# Patient Record
Sex: Male | Born: 1960
Health system: Southern US, Community
[De-identification: ages and names within clinical notes are randomized; demographics above are authoritative.]

## PROBLEM LIST (undated history)

## (undated) DIAGNOSIS — F17203 Nicotine dependence unspecified, with withdrawal: Secondary | ICD-10-CM

## (undated) DIAGNOSIS — K219 Gastro-esophageal reflux disease without esophagitis: Secondary | ICD-10-CM

## (undated) DIAGNOSIS — R0902 Hypoxemia: Secondary | ICD-10-CM

## (undated) DIAGNOSIS — E782 Mixed hyperlipidemia: Secondary | ICD-10-CM

## (undated) DIAGNOSIS — M5106 Intervertebral disc disorders with myelopathy, lumbar region: Secondary | ICD-10-CM

## (undated) DIAGNOSIS — K2100 Gastro-esophageal reflux disease with esophagitis, without bleeding: Secondary | ICD-10-CM

## (undated) DIAGNOSIS — M1990 Unspecified osteoarthritis, unspecified site: Secondary | ICD-10-CM

## (undated) DIAGNOSIS — I10 Essential (primary) hypertension: Secondary | ICD-10-CM

## (undated) DIAGNOSIS — T7840XA Allergy, unspecified, initial encounter: Secondary | ICD-10-CM

## (undated) HISTORY — DX: Mixed hyperlipidemia: E78.2

## (undated) HISTORY — DX: Allergy, unspecified, initial encounter: T78.40XA

## (undated) HISTORY — DX: Unspecified osteoarthritis, unspecified site: M19.90

## (undated) HISTORY — PX: TUMOR EXCISION: SHX421

## (undated) HISTORY — DX: Essential (primary) hypertension: I10

## (undated) HISTORY — DX: Gastro-esophageal reflux disease without esophagitis: K21.9

## (undated) HISTORY — DX: Gastro-esophageal reflux disease with esophagitis, without bleeding: K21.00

## (undated) HISTORY — PX: COLONOSCOPY: SHX174

## (undated) HISTORY — PX: SPINE SURGERY: SHX786

## (undated) HISTORY — DX: Hypoxemia: R09.02

## (undated) HISTORY — PX: HERNIA REPAIR: SHX51

## (undated) HISTORY — DX: Nicotine dependence unspecified, with withdrawal: F17.203

## (undated) HISTORY — PX: CHEST SURGERY: SHX595

## (undated) HISTORY — DX: Intervertebral disc disorders with myelopathy, lumbar region: M51.06

## (undated) HISTORY — PX: LUMBAR DISC SURGERY: SHX700

## (undated) HISTORY — PX: APPENDECTOMY: SHX54

---

## 2002-01-19 ENCOUNTER — Encounter: Payer: Self-pay | Admitting: Neurosurgery

## 2002-01-19 ENCOUNTER — Ambulatory Visit (HOSPITAL_COMMUNITY): Admission: RE | Admit: 2002-01-19 | Discharge: 2002-01-20 | Payer: Self-pay | Admitting: Neurosurgery

## 2002-02-24 ENCOUNTER — Ambulatory Visit (HOSPITAL_COMMUNITY): Admission: RE | Admit: 2002-02-24 | Discharge: 2002-02-24 | Payer: Self-pay | Admitting: Neurosurgery

## 2002-02-24 ENCOUNTER — Encounter: Payer: Self-pay | Admitting: Neurosurgery

## 2002-04-04 ENCOUNTER — Encounter: Payer: Self-pay | Admitting: Neurosurgery

## 2002-04-04 ENCOUNTER — Encounter: Admission: RE | Admit: 2002-04-04 | Discharge: 2002-04-04 | Payer: Self-pay | Admitting: Neurosurgery

## 2002-04-16 ENCOUNTER — Encounter: Admission: RE | Admit: 2002-04-16 | Discharge: 2002-04-16 | Payer: Self-pay | Admitting: Neurosurgery

## 2002-04-16 ENCOUNTER — Encounter: Payer: Self-pay | Admitting: Neurosurgery

## 2002-05-28 ENCOUNTER — Encounter: Admission: RE | Admit: 2002-05-28 | Discharge: 2002-05-28 | Payer: Self-pay | Admitting: Neurosurgery

## 2002-05-28 ENCOUNTER — Encounter: Payer: Self-pay | Admitting: Neurosurgery

## 2002-09-06 ENCOUNTER — Encounter: Admission: RE | Admit: 2002-09-06 | Discharge: 2002-09-06 | Payer: Self-pay | Admitting: Neurosurgery

## 2002-09-06 ENCOUNTER — Encounter: Payer: Self-pay | Admitting: Neurosurgery

## 2004-03-16 ENCOUNTER — Inpatient Hospital Stay (HOSPITAL_COMMUNITY): Admission: RE | Admit: 2004-03-16 | Discharge: 2004-03-21 | Payer: Self-pay | Admitting: Surgery

## 2004-04-07 ENCOUNTER — Encounter: Admission: RE | Admit: 2004-04-07 | Discharge: 2004-04-07 | Payer: Self-pay | Admitting: Surgery

## 2005-09-07 IMAGING — CR DG CHEST 2V
2 series · 2 of 2 positions shown · non-contrast
Comparison: none

CLINICAL DATA: Paraesophageal mass.  Chest pain.  Cough.  
 TWO-VIEW CHEST   
 Comparison 01/19/02.
 A middle mediastinal mass is again seen displacing the trachea anteriorly.  This is not specifically changed.  Heart size remains normal.  Both lungs are clear.
 IMPRESSION 
 Middle mediastinal mass displacing the trachea anteriorly.  No significant change since prior study.
 No acute pulmonary process.

[view not recorded (1 of 2)]
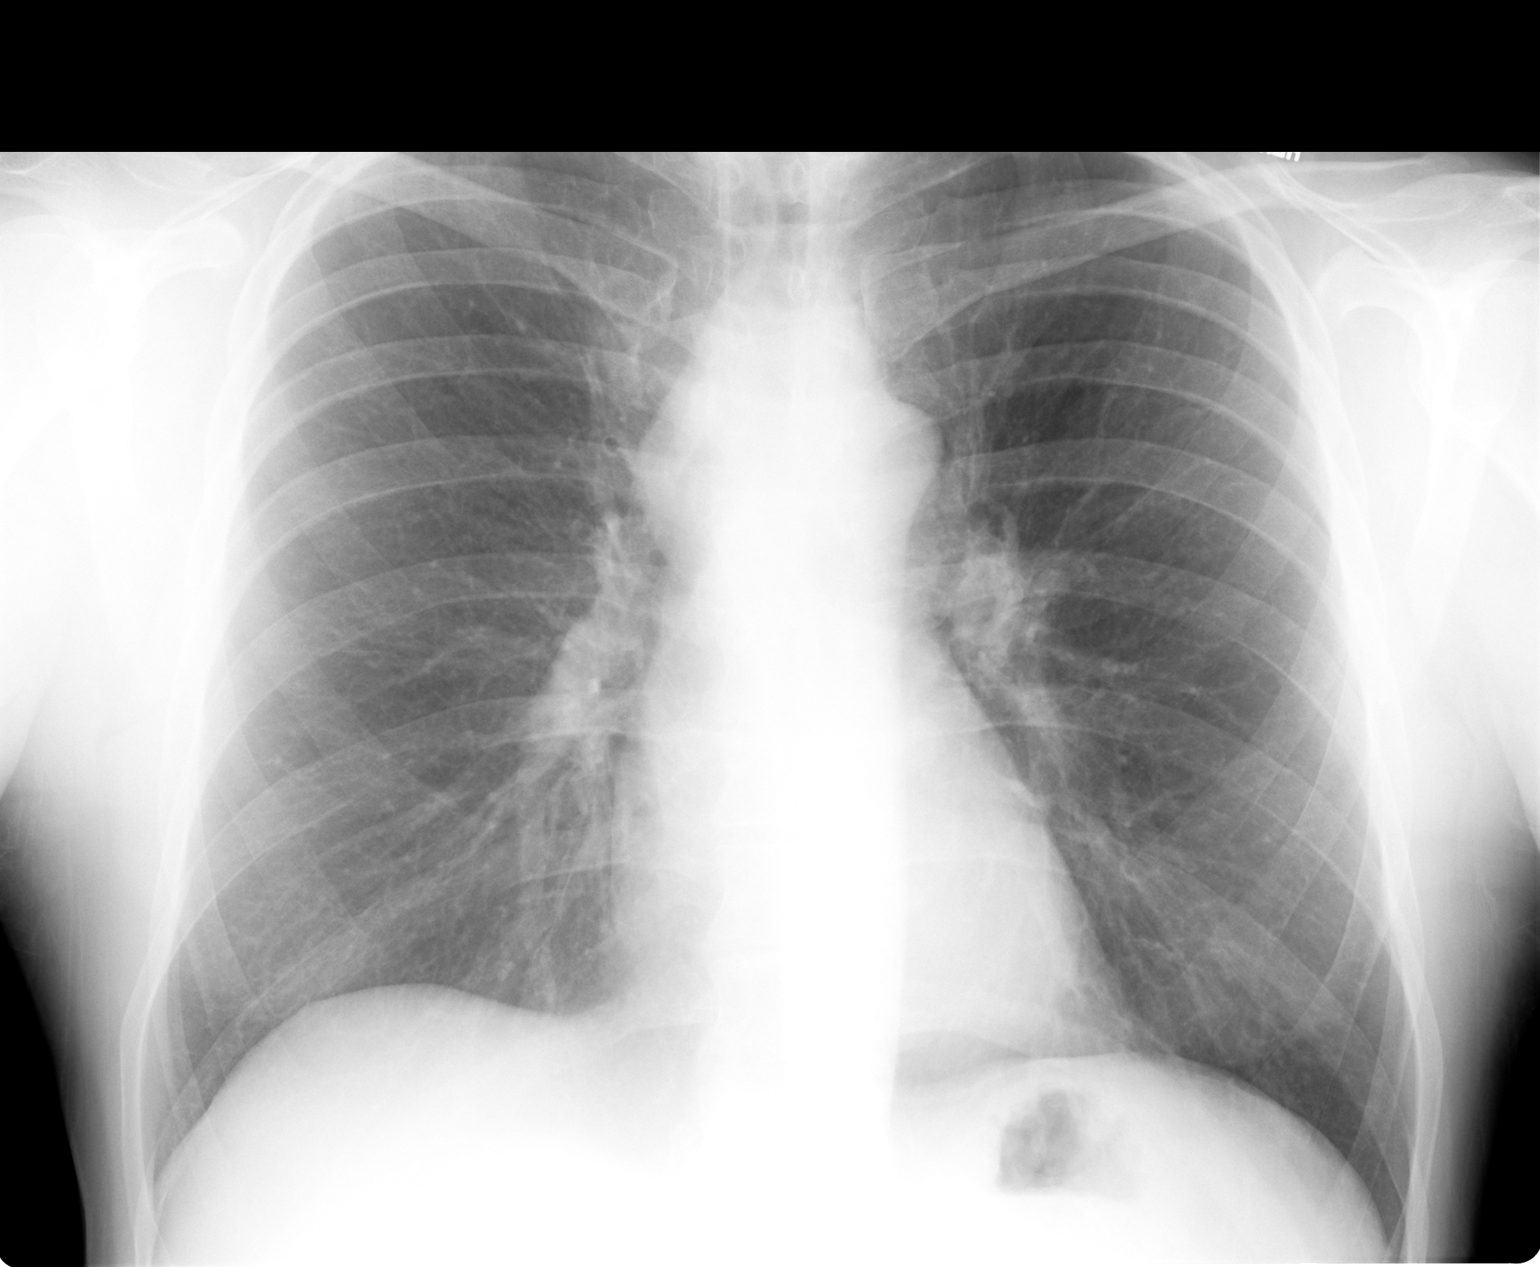

[view not recorded (2 of 2)]
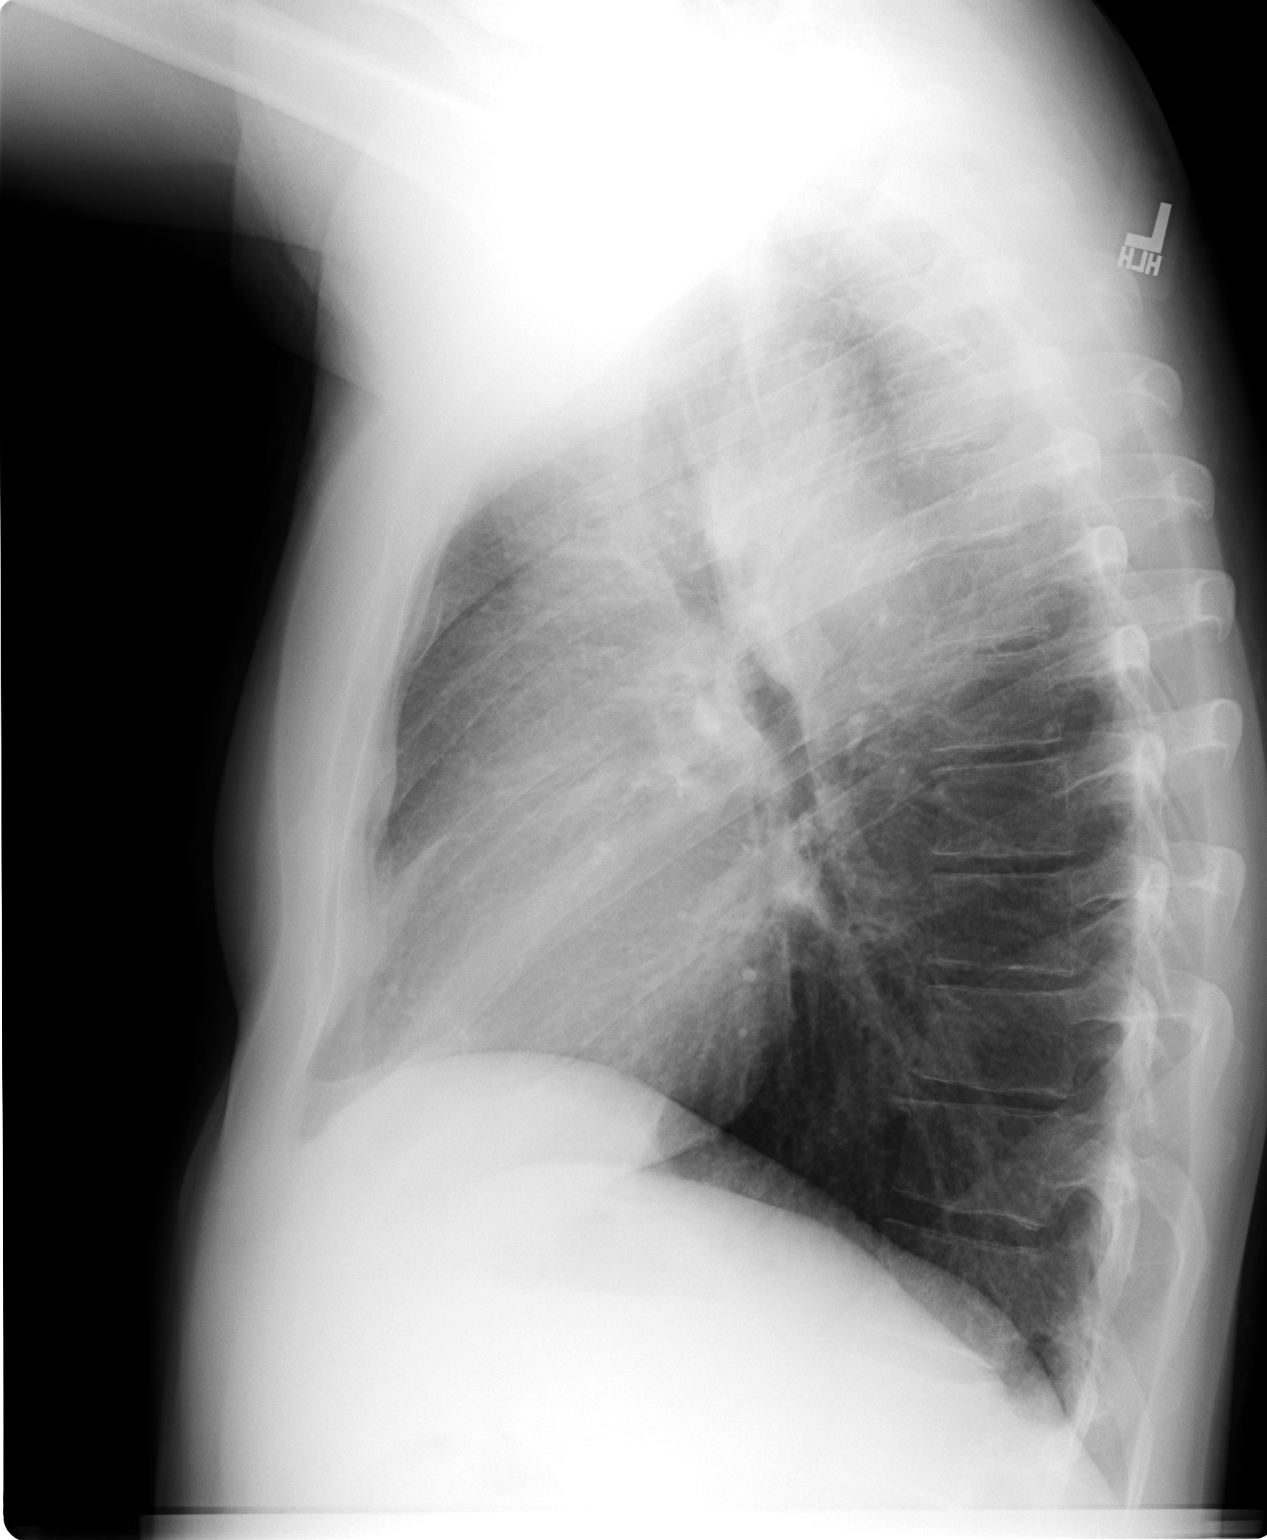

[2 of 2 positions shown; findings below may reference images not displayed]

## 2005-09-08 IMAGING — CR DG CHEST 1V PORT
1 series · 1 of 1 positions shown · non-contrast
Comparison: none

CLINICAL DATA: Surgery for paraesophageal mass.  Follow-up.  
 CHEST PORTABLE ONE VIEW
 6046 HOURS:  Comparison is made to yesterday?s exam.  NG tube is noted traversing the esophagus and proximal stomach.  Right subclavian central venous catheter tip is in the SVC.  Definitely two and possibly three right pleural chest tubes are unchanged in position.  Some improvement in aeration of the lung bases and overall decrease in perivascular edema.  Subsegmental atelectasis at the bases is still present.  No pneumothorax.  Stable appearance of cardiomediastinal silhouette.
 IMPRESSION
 Improved aeration of the lungs.  Bibasilar atelectatic changes are again noted.

[view not recorded]
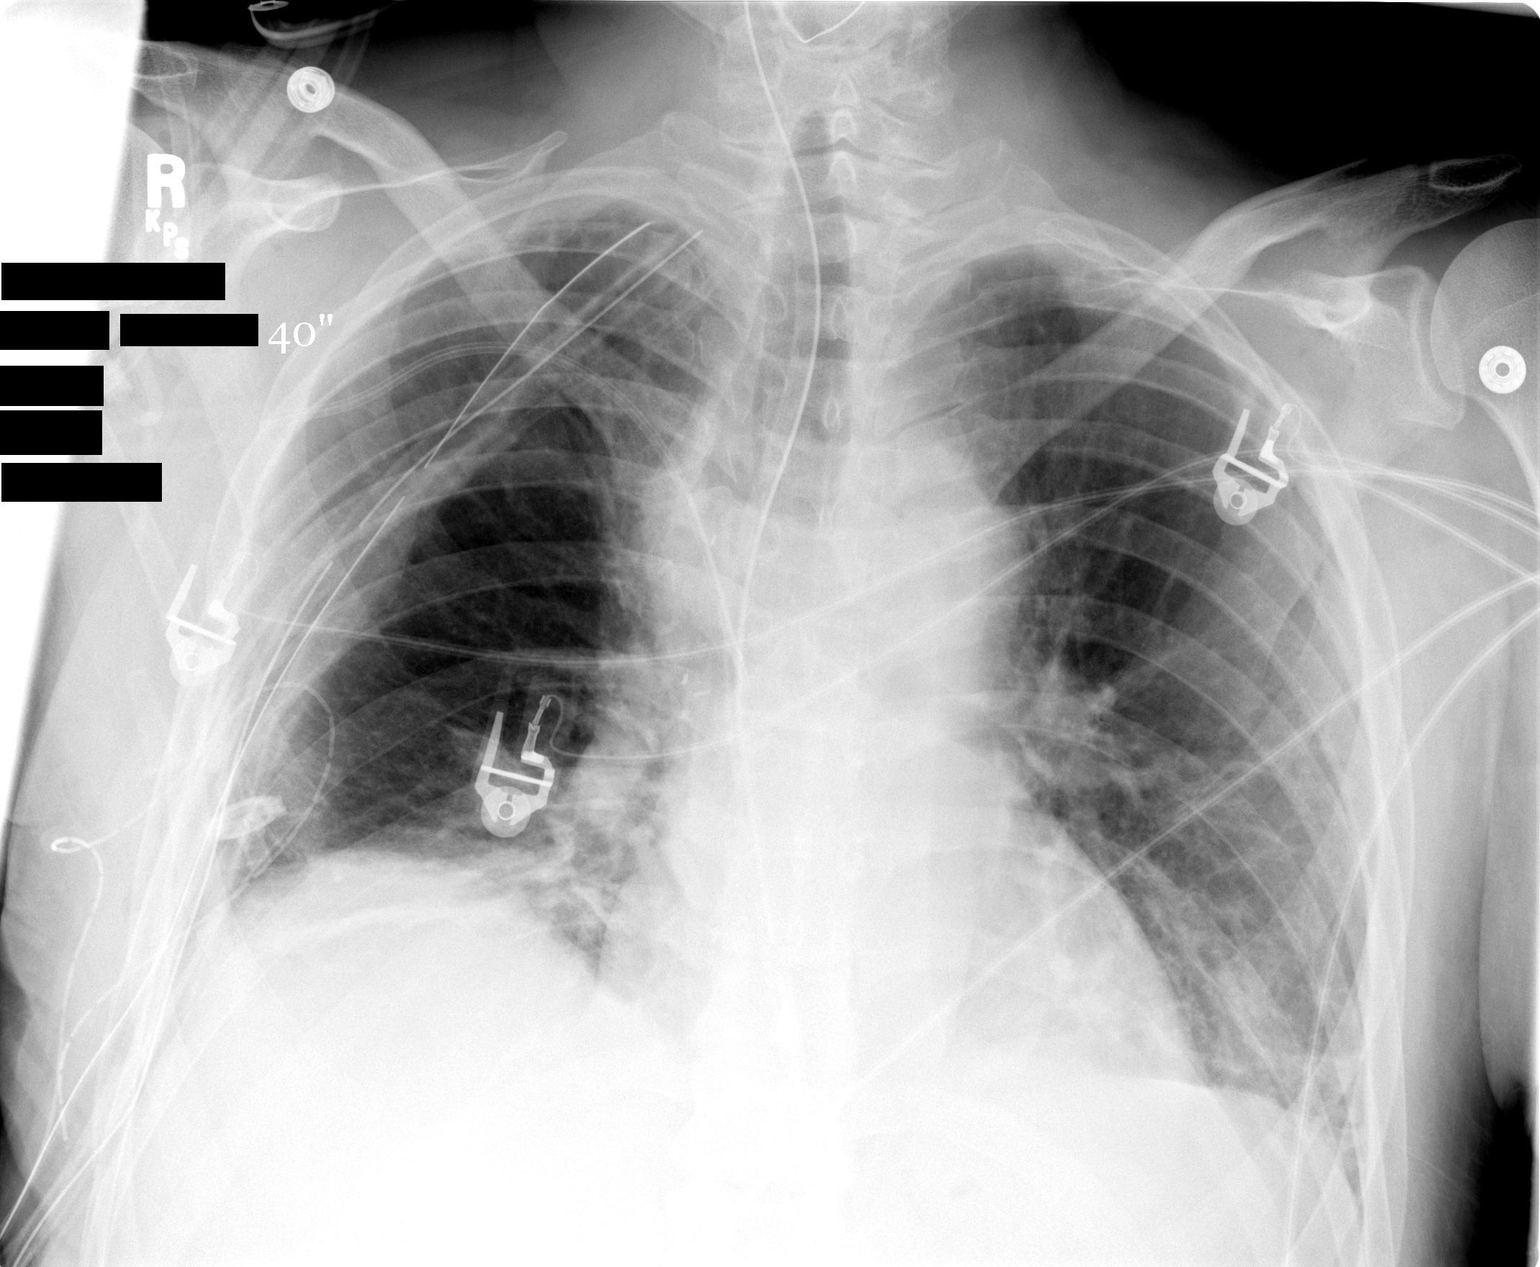

[1 of 1 positions shown; findings below may reference images not displayed]

## 2005-09-11 IMAGING — CR DG CHEST 2V
2 series · 2 of 2 positions shown · non-contrast
Comparison: none

CLINICAL DATA: Status post surgical resection of a large paraesophageal mass four days ago.
 TWO-VIEW CHEST, 03/20/04
 Comparison 03/18/04.
 Interval removal of one of the right chest tubes.  A very tiny right lateral pneumothorax is currently seen, occupying approximately 2 percent of the volume of the right hemithorax.  Stable right subclavian catheter.  Decreased prominence of the right paratracheal soft tissues, at the location of the mass seen on 03/16/04.  Mild bibasilar atelectasis, improved on the left and worse on the right.  Normal sized heart.
 IMPRESSION 
 Interval removal of one of the two right chest tubes with an approximately 2 percent right lateral pneumothorax currently seen.
 Improved postoperative soft tissue swelling in the right paratracheal and azygous regions.  
 Mild bibasilar atelectasis, improved on the left and worse on the right.
 ESOPHAGRAM, 03/20/04
 The patient swallowed Gastrografin without difficulty and without extravasation.  The patient then swallowed barium without difficulty, without extravasation.  A small sliding hiatal hernia is noted.  No luminal narrowing demonstrated.
 No extravasation.
 Small sliding hiatal hernia.

[view not recorded (1 of 2)]
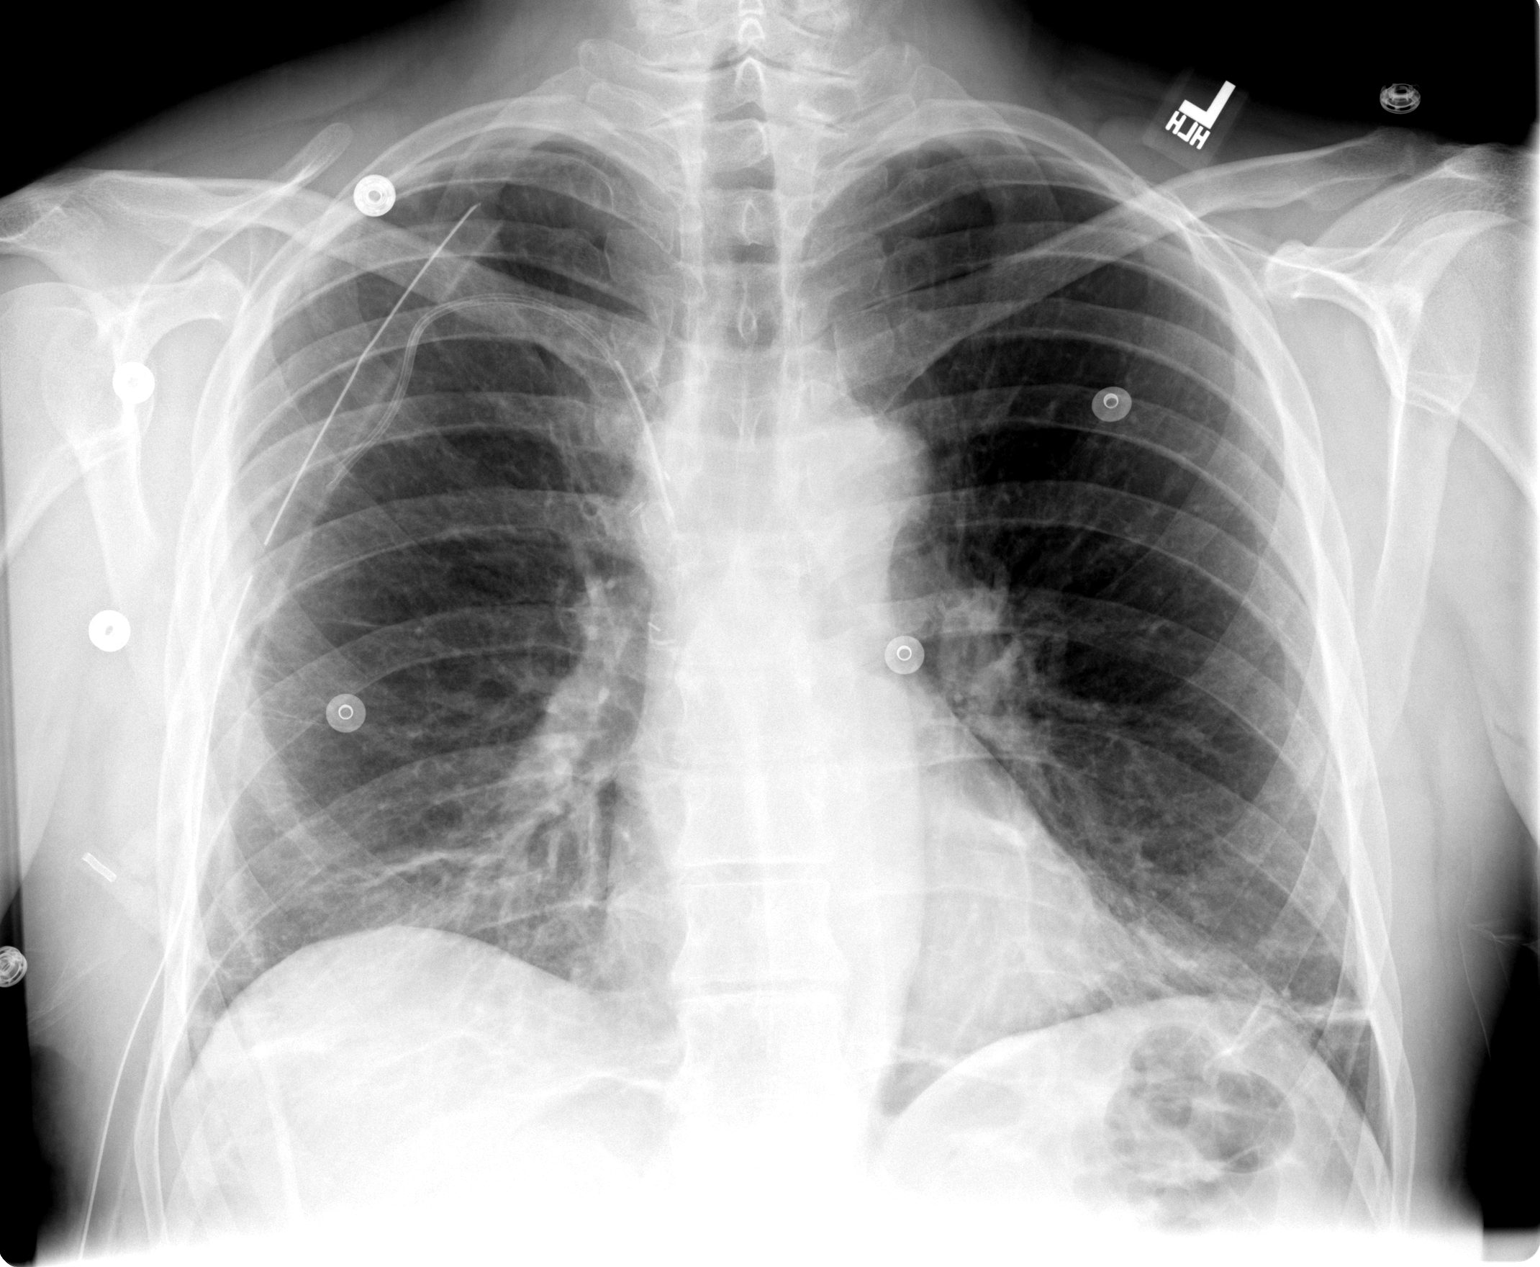

[view not recorded (2 of 2)]
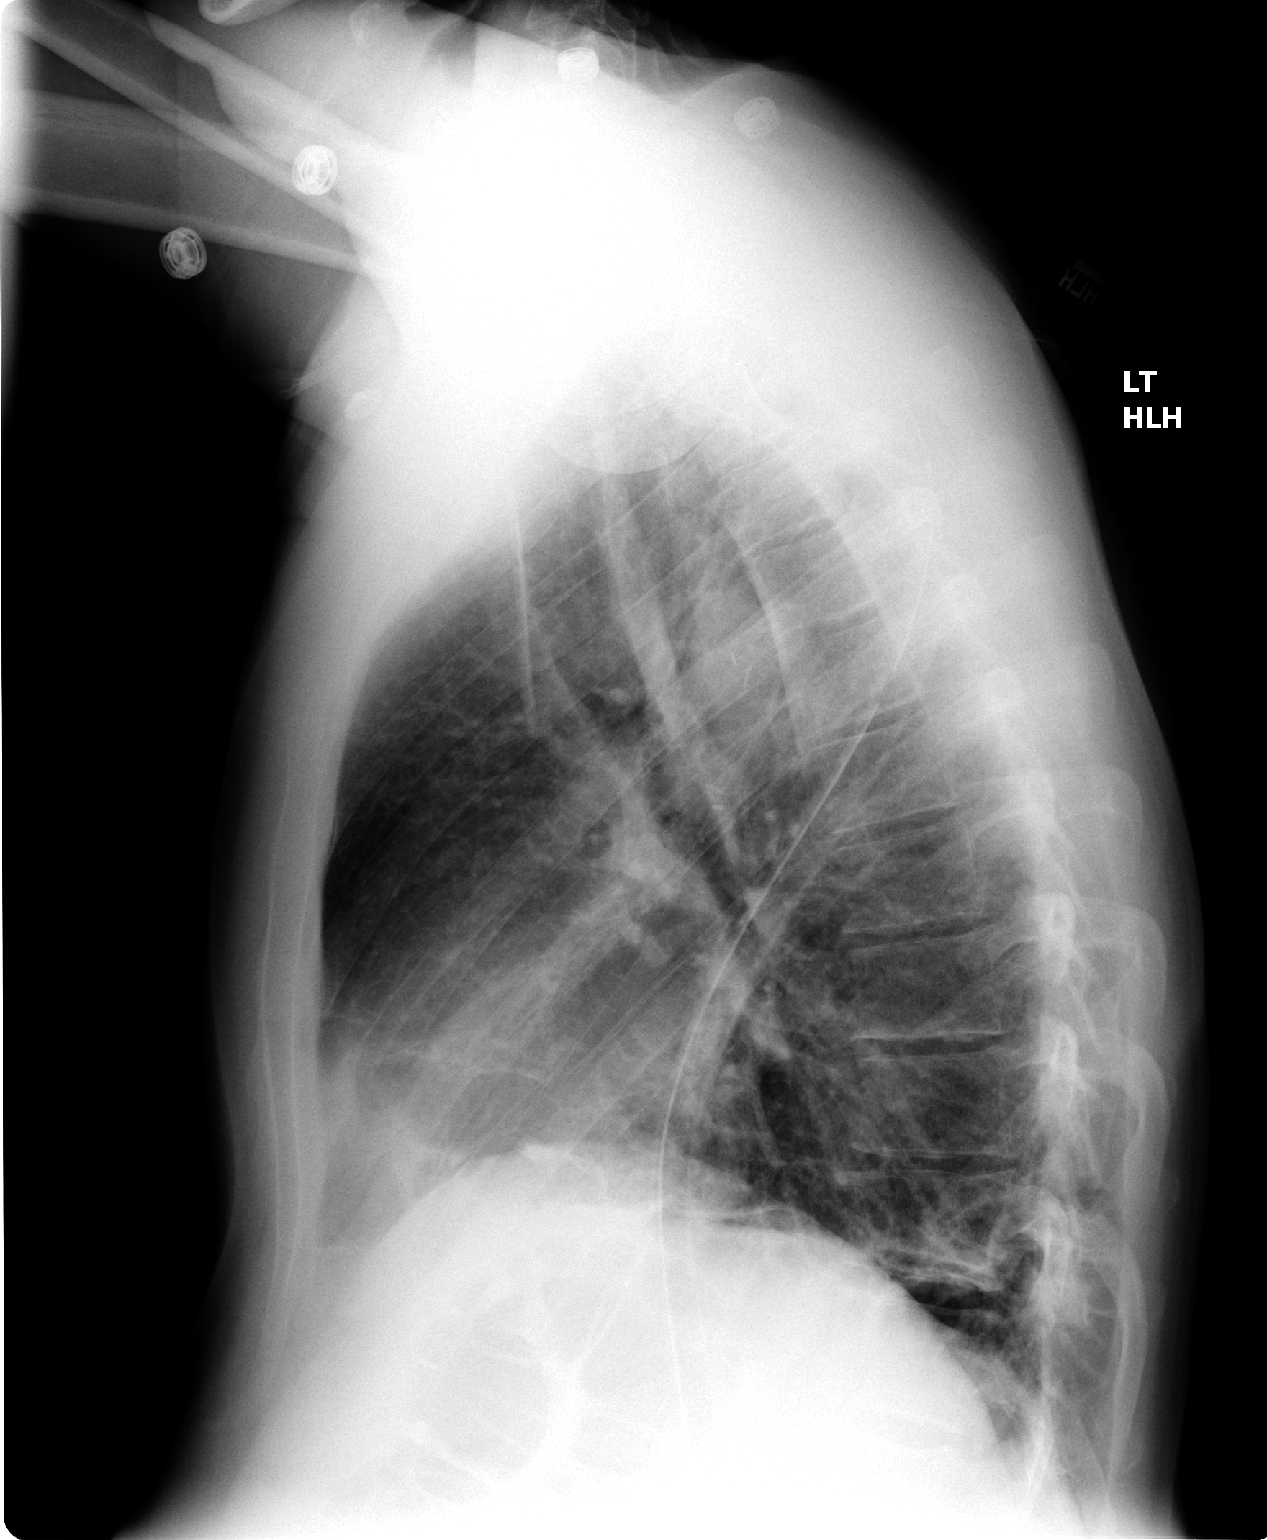

[2 of 2 positions shown; findings below may reference images not displayed]

## 2011-12-08 DIAGNOSIS — D179 Benign lipomatous neoplasm, unspecified: Secondary | ICD-10-CM | POA: Diagnosis not present

## 2012-03-02 DIAGNOSIS — M5126 Other intervertebral disc displacement, lumbar region: Secondary | ICD-10-CM | POA: Diagnosis not present

## 2012-03-02 DIAGNOSIS — IMO0002 Reserved for concepts with insufficient information to code with codable children: Secondary | ICD-10-CM | POA: Diagnosis not present

## 2012-03-02 DIAGNOSIS — M545 Low back pain: Secondary | ICD-10-CM | POA: Diagnosis not present

## 2012-03-02 DIAGNOSIS — M5412 Radiculopathy, cervical region: Secondary | ICD-10-CM | POA: Diagnosis not present

## 2012-03-16 DIAGNOSIS — I10 Essential (primary) hypertension: Secondary | ICD-10-CM | POA: Diagnosis not present

## 2012-03-16 DIAGNOSIS — E7800 Pure hypercholesterolemia, unspecified: Secondary | ICD-10-CM | POA: Diagnosis not present

## 2012-04-12 DIAGNOSIS — IMO0002 Reserved for concepts with insufficient information to code with codable children: Secondary | ICD-10-CM | POA: Diagnosis not present

## 2012-04-12 DIAGNOSIS — M545 Low back pain: Secondary | ICD-10-CM | POA: Diagnosis not present

## 2012-04-12 DIAGNOSIS — M5412 Radiculopathy, cervical region: Secondary | ICD-10-CM | POA: Diagnosis not present

## 2012-04-21 DIAGNOSIS — J189 Pneumonia, unspecified organism: Secondary | ICD-10-CM | POA: Diagnosis not present

## 2012-04-21 DIAGNOSIS — R0602 Shortness of breath: Secondary | ICD-10-CM | POA: Diagnosis not present

## 2012-04-21 DIAGNOSIS — R079 Chest pain, unspecified: Secondary | ICD-10-CM | POA: Diagnosis not present

## 2012-04-21 DIAGNOSIS — F17200 Nicotine dependence, unspecified, uncomplicated: Secondary | ICD-10-CM | POA: Diagnosis not present

## 2012-04-22 DIAGNOSIS — J189 Pneumonia, unspecified organism: Secondary | ICD-10-CM | POA: Diagnosis not present

## 2012-04-22 DIAGNOSIS — R079 Chest pain, unspecified: Secondary | ICD-10-CM | POA: Diagnosis not present

## 2012-04-22 DIAGNOSIS — F17200 Nicotine dependence, unspecified, uncomplicated: Secondary | ICD-10-CM | POA: Diagnosis not present

## 2012-06-16 DIAGNOSIS — M545 Low back pain: Secondary | ICD-10-CM | POA: Diagnosis not present

## 2012-06-16 DIAGNOSIS — M5126 Other intervertebral disc displacement, lumbar region: Secondary | ICD-10-CM | POA: Diagnosis not present

## 2012-06-16 DIAGNOSIS — M5412 Radiculopathy, cervical region: Secondary | ICD-10-CM | POA: Diagnosis not present

## 2012-06-16 DIAGNOSIS — IMO0002 Reserved for concepts with insufficient information to code with codable children: Secondary | ICD-10-CM | POA: Diagnosis not present

## 2012-06-28 DIAGNOSIS — IMO0002 Reserved for concepts with insufficient information to code with codable children: Secondary | ICD-10-CM | POA: Diagnosis not present

## 2012-07-11 DIAGNOSIS — IMO0002 Reserved for concepts with insufficient information to code with codable children: Secondary | ICD-10-CM | POA: Diagnosis not present

## 2012-07-11 DIAGNOSIS — M533 Sacrococcygeal disorders, not elsewhere classified: Secondary | ICD-10-CM | POA: Diagnosis not present

## 2012-08-02 DIAGNOSIS — IMO0002 Reserved for concepts with insufficient information to code with codable children: Secondary | ICD-10-CM | POA: Diagnosis not present

## 2012-08-02 DIAGNOSIS — M545 Low back pain: Secondary | ICD-10-CM | POA: Diagnosis not present

## 2012-10-24 DIAGNOSIS — Z125 Encounter for screening for malignant neoplasm of prostate: Secondary | ICD-10-CM | POA: Diagnosis not present

## 2012-10-24 DIAGNOSIS — I10 Essential (primary) hypertension: Secondary | ICD-10-CM | POA: Diagnosis not present

## 2012-10-24 DIAGNOSIS — Z79899 Other long term (current) drug therapy: Secondary | ICD-10-CM | POA: Diagnosis not present

## 2012-10-24 DIAGNOSIS — E7800 Pure hypercholesterolemia, unspecified: Secondary | ICD-10-CM | POA: Diagnosis not present

## 2013-04-09 DIAGNOSIS — E7800 Pure hypercholesterolemia, unspecified: Secondary | ICD-10-CM | POA: Diagnosis not present

## 2013-04-09 DIAGNOSIS — F17200 Nicotine dependence, unspecified, uncomplicated: Secondary | ICD-10-CM | POA: Diagnosis not present

## 2013-04-09 DIAGNOSIS — I10 Essential (primary) hypertension: Secondary | ICD-10-CM | POA: Diagnosis not present

## 2013-06-04 DIAGNOSIS — M542 Cervicalgia: Secondary | ICD-10-CM | POA: Diagnosis not present

## 2013-06-04 DIAGNOSIS — Z006 Encounter for examination for normal comparison and control in clinical research program: Secondary | ICD-10-CM | POA: Diagnosis not present

## 2013-06-04 DIAGNOSIS — I10 Essential (primary) hypertension: Secondary | ICD-10-CM | POA: Diagnosis not present

## 2013-06-04 DIAGNOSIS — R079 Chest pain, unspecified: Secondary | ICD-10-CM | POA: Diagnosis not present

## 2013-06-07 DIAGNOSIS — R079 Chest pain, unspecified: Secondary | ICD-10-CM | POA: Diagnosis not present

## 2013-09-18 DIAGNOSIS — R109 Unspecified abdominal pain: Secondary | ICD-10-CM | POA: Diagnosis not present

## 2013-09-24 DIAGNOSIS — M545 Low back pain: Secondary | ICD-10-CM | POA: Diagnosis not present

## 2013-09-24 DIAGNOSIS — IMO0002 Reserved for concepts with insufficient information to code with codable children: Secondary | ICD-10-CM | POA: Diagnosis not present

## 2013-11-28 DIAGNOSIS — Z125 Encounter for screening for malignant neoplasm of prostate: Secondary | ICD-10-CM | POA: Diagnosis not present

## 2013-11-28 DIAGNOSIS — F17200 Nicotine dependence, unspecified, uncomplicated: Secondary | ICD-10-CM | POA: Diagnosis not present

## 2013-11-28 DIAGNOSIS — M1990 Unspecified osteoarthritis, unspecified site: Secondary | ICD-10-CM | POA: Diagnosis not present

## 2013-11-28 DIAGNOSIS — Z23 Encounter for immunization: Secondary | ICD-10-CM | POA: Diagnosis not present

## 2014-06-03 DIAGNOSIS — M542 Cervicalgia: Secondary | ICD-10-CM | POA: Diagnosis not present

## 2014-07-17 DIAGNOSIS — J329 Chronic sinusitis, unspecified: Secondary | ICD-10-CM | POA: Diagnosis not present

## 2014-07-17 DIAGNOSIS — F17200 Nicotine dependence, unspecified, uncomplicated: Secondary | ICD-10-CM | POA: Diagnosis not present

## 2015-01-10 DIAGNOSIS — Z79899 Other long term (current) drug therapy: Secondary | ICD-10-CM | POA: Diagnosis not present

## 2015-01-10 DIAGNOSIS — E782 Mixed hyperlipidemia: Secondary | ICD-10-CM | POA: Diagnosis not present

## 2015-01-15 DIAGNOSIS — Z720 Tobacco use: Secondary | ICD-10-CM | POA: Diagnosis not present

## 2015-01-15 DIAGNOSIS — Z0000 Encounter for general adult medical examination without abnormal findings: Secondary | ICD-10-CM | POA: Diagnosis not present

## 2015-01-15 DIAGNOSIS — R7302 Impaired glucose tolerance (oral): Secondary | ICD-10-CM | POA: Diagnosis not present

## 2015-05-02 DIAGNOSIS — Z1389 Encounter for screening for other disorder: Secondary | ICD-10-CM | POA: Diagnosis not present

## 2015-05-02 DIAGNOSIS — M659 Synovitis and tenosynovitis, unspecified: Secondary | ICD-10-CM | POA: Diagnosis not present

## 2015-07-16 DIAGNOSIS — M62838 Other muscle spasm: Secondary | ICD-10-CM | POA: Diagnosis not present

## 2015-07-16 DIAGNOSIS — S335XXA Sprain of ligaments of lumbar spine, initial encounter: Secondary | ICD-10-CM | POA: Diagnosis not present

## 2015-08-06 DIAGNOSIS — Z720 Tobacco use: Secondary | ICD-10-CM | POA: Diagnosis not present

## 2015-08-06 DIAGNOSIS — R079 Chest pain, unspecified: Secondary | ICD-10-CM | POA: Diagnosis not present

## 2016-06-15 DIAGNOSIS — F17200 Nicotine dependence, unspecified, uncomplicated: Secondary | ICD-10-CM | POA: Diagnosis not present

## 2016-06-15 DIAGNOSIS — D171 Benign lipomatous neoplasm of skin and subcutaneous tissue of trunk: Secondary | ICD-10-CM | POA: Diagnosis not present

## 2016-06-15 DIAGNOSIS — Z0000 Encounter for general adult medical examination without abnormal findings: Secondary | ICD-10-CM | POA: Diagnosis not present

## 2016-06-24 DIAGNOSIS — D171 Benign lipomatous neoplasm of skin and subcutaneous tissue of trunk: Secondary | ICD-10-CM

## 2016-06-24 HISTORY — DX: Benign lipomatous neoplasm of skin and subcutaneous tissue of trunk: D17.1

## 2016-07-08 DIAGNOSIS — Z0000 Encounter for general adult medical examination without abnormal findings: Secondary | ICD-10-CM | POA: Diagnosis not present

## 2016-07-08 DIAGNOSIS — I10 Essential (primary) hypertension: Secondary | ICD-10-CM | POA: Diagnosis not present

## 2016-07-09 DIAGNOSIS — E785 Hyperlipidemia, unspecified: Secondary | ICD-10-CM | POA: Diagnosis not present

## 2016-07-09 DIAGNOSIS — I10 Essential (primary) hypertension: Secondary | ICD-10-CM | POA: Diagnosis not present

## 2016-07-09 DIAGNOSIS — F17210 Nicotine dependence, cigarettes, uncomplicated: Secondary | ICD-10-CM | POA: Diagnosis not present

## 2016-07-09 DIAGNOSIS — K219 Gastro-esophageal reflux disease without esophagitis: Secondary | ICD-10-CM | POA: Diagnosis not present

## 2016-07-09 DIAGNOSIS — Z79899 Other long term (current) drug therapy: Secondary | ICD-10-CM | POA: Diagnosis not present

## 2016-07-09 DIAGNOSIS — Z7982 Long term (current) use of aspirin: Secondary | ICD-10-CM | POA: Diagnosis not present

## 2016-07-09 DIAGNOSIS — J449 Chronic obstructive pulmonary disease, unspecified: Secondary | ICD-10-CM | POA: Diagnosis not present

## 2016-07-09 DIAGNOSIS — D171 Benign lipomatous neoplasm of skin and subcutaneous tissue of trunk: Secondary | ICD-10-CM | POA: Diagnosis not present

## 2016-09-17 DIAGNOSIS — I10 Essential (primary) hypertension: Secondary | ICD-10-CM | POA: Diagnosis not present

## 2016-09-17 DIAGNOSIS — Z79899 Other long term (current) drug therapy: Secondary | ICD-10-CM | POA: Diagnosis not present

## 2016-09-17 DIAGNOSIS — Z23 Encounter for immunization: Secondary | ICD-10-CM | POA: Diagnosis not present

## 2016-09-17 DIAGNOSIS — E785 Hyperlipidemia, unspecified: Secondary | ICD-10-CM | POA: Diagnosis not present

## 2016-09-17 DIAGNOSIS — R5383 Other fatigue: Secondary | ICD-10-CM | POA: Diagnosis not present

## 2016-09-17 DIAGNOSIS — Z720 Tobacco use: Secondary | ICD-10-CM | POA: Diagnosis not present

## 2016-09-17 DIAGNOSIS — Z2821 Immunization not carried out because of patient refusal: Secondary | ICD-10-CM | POA: Diagnosis not present

## 2016-09-17 DIAGNOSIS — K219 Gastro-esophageal reflux disease without esophagitis: Secondary | ICD-10-CM | POA: Diagnosis not present

## 2017-02-15 DIAGNOSIS — E782 Mixed hyperlipidemia: Secondary | ICD-10-CM | POA: Diagnosis not present

## 2017-02-22 DIAGNOSIS — Z720 Tobacco use: Secondary | ICD-10-CM | POA: Diagnosis not present

## 2017-02-22 DIAGNOSIS — I10 Essential (primary) hypertension: Secondary | ICD-10-CM | POA: Diagnosis not present

## 2017-02-22 DIAGNOSIS — L989 Disorder of the skin and subcutaneous tissue, unspecified: Secondary | ICD-10-CM | POA: Diagnosis not present

## 2017-02-22 DIAGNOSIS — Z6824 Body mass index (BMI) 24.0-24.9, adult: Secondary | ICD-10-CM | POA: Diagnosis not present

## 2017-03-10 DIAGNOSIS — F17200 Nicotine dependence, unspecified, uncomplicated: Secondary | ICD-10-CM | POA: Diagnosis not present

## 2017-03-10 DIAGNOSIS — Z86010 Personal history of colonic polyps: Secondary | ICD-10-CM | POA: Diagnosis not present

## 2017-03-10 DIAGNOSIS — Z1211 Encounter for screening for malignant neoplasm of colon: Secondary | ICD-10-CM | POA: Diagnosis not present

## 2017-04-14 DIAGNOSIS — Z1211 Encounter for screening for malignant neoplasm of colon: Secondary | ICD-10-CM | POA: Diagnosis not present

## 2017-04-14 DIAGNOSIS — Z79899 Other long term (current) drug therapy: Secondary | ICD-10-CM | POA: Diagnosis not present

## 2017-04-14 DIAGNOSIS — Z9049 Acquired absence of other specified parts of digestive tract: Secondary | ICD-10-CM | POA: Diagnosis not present

## 2017-04-14 DIAGNOSIS — G43909 Migraine, unspecified, not intractable, without status migrainosus: Secondary | ICD-10-CM | POA: Diagnosis not present

## 2017-04-14 DIAGNOSIS — Z86010 Personal history of colonic polyps: Secondary | ICD-10-CM | POA: Diagnosis not present

## 2017-04-14 DIAGNOSIS — K648 Other hemorrhoids: Secondary | ICD-10-CM | POA: Diagnosis not present

## 2017-04-14 DIAGNOSIS — J45909 Unspecified asthma, uncomplicated: Secondary | ICD-10-CM | POA: Diagnosis not present

## 2017-04-14 DIAGNOSIS — F419 Anxiety disorder, unspecified: Secondary | ICD-10-CM | POA: Diagnosis not present

## 2017-04-14 DIAGNOSIS — D122 Benign neoplasm of ascending colon: Secondary | ICD-10-CM | POA: Diagnosis not present

## 2017-04-14 DIAGNOSIS — J449 Chronic obstructive pulmonary disease, unspecified: Secondary | ICD-10-CM | POA: Diagnosis not present

## 2017-04-14 DIAGNOSIS — E785 Hyperlipidemia, unspecified: Secondary | ICD-10-CM | POA: Diagnosis not present

## 2017-04-14 DIAGNOSIS — I10 Essential (primary) hypertension: Secondary | ICD-10-CM | POA: Diagnosis not present

## 2017-04-14 DIAGNOSIS — F17210 Nicotine dependence, cigarettes, uncomplicated: Secondary | ICD-10-CM | POA: Diagnosis not present

## 2017-04-14 DIAGNOSIS — Z7982 Long term (current) use of aspirin: Secondary | ICD-10-CM | POA: Diagnosis not present

## 2017-04-14 DIAGNOSIS — F17200 Nicotine dependence, unspecified, uncomplicated: Secondary | ICD-10-CM | POA: Diagnosis not present

## 2017-04-14 DIAGNOSIS — K219 Gastro-esophageal reflux disease without esophagitis: Secondary | ICD-10-CM | POA: Diagnosis not present

## 2017-04-14 DIAGNOSIS — K5730 Diverticulosis of large intestine without perforation or abscess without bleeding: Secondary | ICD-10-CM | POA: Diagnosis not present

## 2017-04-14 DIAGNOSIS — K635 Polyp of colon: Secondary | ICD-10-CM | POA: Diagnosis not present

## 2017-04-14 LAB — HM COLONOSCOPY

## 2017-05-03 DIAGNOSIS — F419 Anxiety disorder, unspecified: Secondary | ICD-10-CM | POA: Diagnosis not present

## 2017-05-03 DIAGNOSIS — Z6822 Body mass index (BMI) 22.0-22.9, adult: Secondary | ICD-10-CM | POA: Diagnosis not present

## 2017-05-03 DIAGNOSIS — Z720 Tobacco use: Secondary | ICD-10-CM | POA: Diagnosis not present

## 2017-05-03 DIAGNOSIS — Z1389 Encounter for screening for other disorder: Secondary | ICD-10-CM | POA: Diagnosis not present

## 2017-10-18 DIAGNOSIS — M5431 Sciatica, right side: Secondary | ICD-10-CM | POA: Diagnosis not present

## 2017-10-18 DIAGNOSIS — Z0000 Encounter for general adult medical examination without abnormal findings: Secondary | ICD-10-CM | POA: Diagnosis not present

## 2017-10-18 DIAGNOSIS — Z23 Encounter for immunization: Secondary | ICD-10-CM | POA: Diagnosis not present

## 2017-10-18 DIAGNOSIS — Z1331 Encounter for screening for depression: Secondary | ICD-10-CM | POA: Diagnosis not present

## 2017-10-18 DIAGNOSIS — Z6826 Body mass index (BMI) 26.0-26.9, adult: Secondary | ICD-10-CM | POA: Diagnosis not present

## 2017-10-18 DIAGNOSIS — Z1339 Encounter for screening examination for other mental health and behavioral disorders: Secondary | ICD-10-CM | POA: Diagnosis not present

## 2017-11-03 DIAGNOSIS — J40 Bronchitis, not specified as acute or chronic: Secondary | ICD-10-CM | POA: Diagnosis not present

## 2017-11-03 DIAGNOSIS — Z9181 History of falling: Secondary | ICD-10-CM | POA: Diagnosis not present

## 2017-11-03 DIAGNOSIS — Z6826 Body mass index (BMI) 26.0-26.9, adult: Secondary | ICD-10-CM | POA: Diagnosis not present

## 2017-11-03 DIAGNOSIS — J329 Chronic sinusitis, unspecified: Secondary | ICD-10-CM | POA: Diagnosis not present

## 2017-11-17 DIAGNOSIS — Z6826 Body mass index (BMI) 26.0-26.9, adult: Secondary | ICD-10-CM | POA: Diagnosis not present

## 2017-11-17 DIAGNOSIS — Z720 Tobacco use: Secondary | ICD-10-CM | POA: Diagnosis not present

## 2017-11-17 DIAGNOSIS — J019 Acute sinusitis, unspecified: Secondary | ICD-10-CM | POA: Diagnosis not present

## 2018-01-18 DIAGNOSIS — Z743 Need for continuous supervision: Secondary | ICD-10-CM | POA: Diagnosis not present

## 2018-01-18 DIAGNOSIS — R29818 Other symptoms and signs involving the nervous system: Secondary | ICD-10-CM | POA: Diagnosis not present

## 2018-01-18 DIAGNOSIS — R9431 Abnormal electrocardiogram [ECG] [EKG]: Secondary | ICD-10-CM | POA: Diagnosis not present

## 2018-01-18 DIAGNOSIS — F17200 Nicotine dependence, unspecified, uncomplicated: Secondary | ICD-10-CM | POA: Diagnosis not present

## 2018-01-18 DIAGNOSIS — R6883 Chills (without fever): Secondary | ICD-10-CM | POA: Diagnosis not present

## 2018-01-18 DIAGNOSIS — Z7982 Long term (current) use of aspirin: Secondary | ICD-10-CM | POA: Diagnosis not present

## 2018-01-18 DIAGNOSIS — Z720 Tobacco use: Secondary | ICD-10-CM | POA: Diagnosis not present

## 2018-01-18 DIAGNOSIS — R42 Dizziness and giddiness: Secondary | ICD-10-CM | POA: Diagnosis not present

## 2018-01-18 DIAGNOSIS — R531 Weakness: Secondary | ICD-10-CM | POA: Diagnosis not present

## 2018-01-18 DIAGNOSIS — R55 Syncope and collapse: Secondary | ICD-10-CM | POA: Diagnosis not present

## 2018-01-30 DIAGNOSIS — E7439 Other disorders of intestinal carbohydrate absorption: Secondary | ICD-10-CM | POA: Diagnosis not present

## 2018-01-30 DIAGNOSIS — Z79899 Other long term (current) drug therapy: Secondary | ICD-10-CM | POA: Diagnosis not present

## 2018-01-30 DIAGNOSIS — K219 Gastro-esophageal reflux disease without esophagitis: Secondary | ICD-10-CM | POA: Diagnosis not present

## 2018-01-30 DIAGNOSIS — Z125 Encounter for screening for malignant neoplasm of prostate: Secondary | ICD-10-CM | POA: Diagnosis not present

## 2018-01-30 DIAGNOSIS — I10 Essential (primary) hypertension: Secondary | ICD-10-CM | POA: Diagnosis not present

## 2018-01-30 DIAGNOSIS — E785 Hyperlipidemia, unspecified: Secondary | ICD-10-CM | POA: Diagnosis not present

## 2018-01-30 DIAGNOSIS — Z6827 Body mass index (BMI) 27.0-27.9, adult: Secondary | ICD-10-CM | POA: Diagnosis not present

## 2018-01-30 DIAGNOSIS — Z720 Tobacco use: Secondary | ICD-10-CM | POA: Diagnosis not present

## 2018-01-30 DIAGNOSIS — D519 Vitamin B12 deficiency anemia, unspecified: Secondary | ICD-10-CM | POA: Diagnosis not present

## 2018-02-03 DIAGNOSIS — Z87891 Personal history of nicotine dependence: Secondary | ICD-10-CM | POA: Diagnosis not present

## 2018-02-03 DIAGNOSIS — F17210 Nicotine dependence, cigarettes, uncomplicated: Secondary | ICD-10-CM | POA: Diagnosis not present

## 2018-08-29 DIAGNOSIS — M5106 Intervertebral disc disorders with myelopathy, lumbar region: Secondary | ICD-10-CM | POA: Diagnosis not present

## 2018-08-29 DIAGNOSIS — F17203 Nicotine dependence unspecified, with withdrawal: Secondary | ICD-10-CM | POA: Diagnosis not present

## 2018-08-29 DIAGNOSIS — Z6827 Body mass index (BMI) 27.0-27.9, adult: Secondary | ICD-10-CM | POA: Diagnosis not present

## 2018-08-29 DIAGNOSIS — I10 Essential (primary) hypertension: Secondary | ICD-10-CM | POA: Diagnosis not present

## 2018-08-29 DIAGNOSIS — E782 Mixed hyperlipidemia: Secondary | ICD-10-CM | POA: Diagnosis not present

## 2018-08-29 DIAGNOSIS — Z23 Encounter for immunization: Secondary | ICD-10-CM | POA: Diagnosis not present

## 2018-09-05 DIAGNOSIS — E782 Mixed hyperlipidemia: Secondary | ICD-10-CM | POA: Diagnosis not present

## 2018-09-05 DIAGNOSIS — I10 Essential (primary) hypertension: Secondary | ICD-10-CM | POA: Diagnosis not present

## 2018-09-12 DIAGNOSIS — R070 Pain in throat: Secondary | ICD-10-CM | POA: Diagnosis not present

## 2018-09-12 DIAGNOSIS — Z6826 Body mass index (BMI) 26.0-26.9, adult: Secondary | ICD-10-CM | POA: Diagnosis not present

## 2018-09-12 DIAGNOSIS — J018 Other acute sinusitis: Secondary | ICD-10-CM | POA: Diagnosis not present

## 2019-07-03 DIAGNOSIS — M545 Low back pain: Secondary | ICD-10-CM | POA: Diagnosis not present

## 2019-10-05 DIAGNOSIS — M545 Low back pain: Secondary | ICD-10-CM | POA: Diagnosis not present

## 2019-12-20 DIAGNOSIS — Z6826 Body mass index (BMI) 26.0-26.9, adult: Secondary | ICD-10-CM | POA: Diagnosis not present

## 2019-12-20 DIAGNOSIS — F17203 Nicotine dependence unspecified, with withdrawal: Secondary | ICD-10-CM | POA: Diagnosis not present

## 2019-12-20 DIAGNOSIS — R072 Precordial pain: Secondary | ICD-10-CM | POA: Diagnosis not present

## 2019-12-20 DIAGNOSIS — Z1159 Encounter for screening for other viral diseases: Secondary | ICD-10-CM | POA: Diagnosis not present

## 2019-12-20 DIAGNOSIS — E782 Mixed hyperlipidemia: Secondary | ICD-10-CM | POA: Diagnosis not present

## 2019-12-20 DIAGNOSIS — I10 Essential (primary) hypertension: Secondary | ICD-10-CM | POA: Diagnosis not present

## 2019-12-20 DIAGNOSIS — R079 Chest pain, unspecified: Secondary | ICD-10-CM | POA: Diagnosis not present

## 2019-12-24 DIAGNOSIS — R7309 Other abnormal glucose: Secondary | ICD-10-CM | POA: Diagnosis not present

## 2019-12-25 ENCOUNTER — Encounter: Payer: Self-pay | Admitting: Cardiology

## 2019-12-25 ENCOUNTER — Encounter: Payer: Self-pay | Admitting: *Deleted

## 2019-12-26 ENCOUNTER — Other Ambulatory Visit: Payer: Self-pay

## 2019-12-26 ENCOUNTER — Encounter: Payer: Self-pay | Admitting: *Deleted

## 2019-12-26 ENCOUNTER — Encounter: Payer: Self-pay | Admitting: Cardiology

## 2019-12-26 ENCOUNTER — Ambulatory Visit (INDEPENDENT_AMBULATORY_CARE_PROVIDER_SITE_OTHER): Payer: Medicare Other | Admitting: Cardiology

## 2019-12-26 VITALS — BP 160/90 | HR 78 | Ht 71.0 in | Wt 193.0 lb

## 2019-12-26 DIAGNOSIS — R072 Precordial pain: Secondary | ICD-10-CM | POA: Insufficient documentation

## 2019-12-26 DIAGNOSIS — E782 Mixed hyperlipidemia: Secondary | ICD-10-CM | POA: Diagnosis not present

## 2019-12-26 DIAGNOSIS — I10 Essential (primary) hypertension: Secondary | ICD-10-CM

## 2019-12-26 DIAGNOSIS — E785 Hyperlipidemia, unspecified: Secondary | ICD-10-CM | POA: Insufficient documentation

## 2019-12-26 DIAGNOSIS — I491 Atrial premature depolarization: Secondary | ICD-10-CM

## 2019-12-26 DIAGNOSIS — Z720 Tobacco use: Secondary | ICD-10-CM

## 2019-12-26 HISTORY — DX: Tobacco use: Z72.0

## 2019-12-26 HISTORY — DX: Essential (primary) hypertension: I10

## 2019-12-26 HISTORY — DX: Atrial premature depolarization: I49.1

## 2019-12-26 HISTORY — DX: Precordial pain: R07.2

## 2019-12-26 MED ORDER — CARVEDILOL 6.25 MG PO TABS: 6.2500 mg | ORAL_TABLET | Freq: Two times a day (BID) | ORAL | 1 refills | Status: DC

## 2019-12-26 NOTE — Patient Instructions (Signed)
Medication Instructions:  Your physician has recommended you make the following change in your medication:   STOP: Lopressor(metoprolol)  START: Coreg(carvedilol) 6.25 mg Take 1 tab twice daily  *If you need a refill on your cardiac medications before your next appointment, please call your pharmacy*  Lab Work: NOne If you have labs (blood work) drawn today and your tests are completely normal, you will receive your results only by: Marland Kitchen MyChart Message (if you have MyChart) OR . A paper copy in the mail If you have any lab test that is abnormal or we need to change your treatment, we will call you to review the results.  Testing/Procedures: Your physician has requested that you have a lexiscan myoview. For further information please visit [email or web address]. Please follow instruction sheet, as given.   Follow-Up: At [facility], you and your health needs are our priority.  As part of our continuing mission to provide you with exceptional heart care, we have created designated Provider Care Teams.  These Care Teams include your primary Cardiologist (physician) and Advanced Practice Providers (APPs -  Physician Assistants and Nurse Practitioners) who all work together to provide you with the care you need, when you need it.  Your next appointment:   1 month(s)  The format for your next appointment:   In Person  Provider:   Berniece Salines, DO  Other Instructions  Cardiac Nuclear Scan A cardiac nuclear scan is a test that is done to check the flow of blood to your heart. It is done when you are resting and when you are exercising. The test looks for problems such as:  Not enough blood reaching a portion of the heart.  The heart muscle not working as it should. You may need this test if:  You have heart disease.  You have had lab results that are not normal.  You have had heart surgery or a balloon procedure to open up blocked arteries (angioplasty).  You have chest  pain.  You have shortness of breath. In this test, a special dye (tracer) is put into your bloodstream. The tracer will travel to your heart. A camera will then take pictures of your heart to see how the tracer moves through your heart. This test is usually done at a hospital and takes 2-4 hours. Tell a doctor about:  Any allergies you have.  All medicines you are taking, including vitamins, herbs, eye drops, creams, and over-the-counter medicines.  Any problems you or family members have had with anesthetic medicines.  Any blood disorders you have.  Any surgeries you have had.  Any medical conditions you have.  Whether you are pregnant or may be pregnant. What are the risks? Generally, this is a safe test. However, problems may occur, such as:  Serious chest pain and heart attack. This is only a risk if the stress portion of the test is done.  Rapid heartbeat.  A feeling of warmth in your chest. This feeling usually does not last long.  Allergic reaction to the tracer. What happens before the test?  Ask your doctor about changing or stopping your normal medicines. This is important.  Follow instructions from your doctor about what you cannot eat or drink.  Remove your jewelry on the day of the test. What happens during the test?  An IV tube will be inserted into one of your veins.  Your doctor will give you a small amount of tracer through the IV tube.  You will wait for 20-40 minutes  while the tracer moves through your bloodstream.  Your heart will be monitored with an electrocardiogram (ECG).  You will lie down on an exam table.  Pictures of your heart will be taken for about 15-20 minutes.  You may also have a stress test. For this test, one of these things may be done: ? You will be asked to exercise on a treadmill or a stationary bike. ? You will be given medicines that will make your heart work harder. This is done if you are unable to exercise.  When  blood flow to your heart has peaked, a tracer will again be given through the IV tube.  After 20-40 minutes, you will get back on the exam table. More pictures will be taken of your heart.  Depending on the tracer that is used, more pictures may need to be taken 3-4 hours later.  Your IV tube will be removed when the test is over. The test may vary among doctors and hospitals. What happens after the test?  Ask your doctor: ? Whether you can return to your normal schedule, including diet, activities, and medicines. ? Whether you should drink more fluids. This will help to remove the tracer from your body. Drink enough fluid to keep your pee (urine) pale yellow.  Ask your doctor, or the department that is doing the test: ? When will my results be ready? ? How will I get my results? Summary  A cardiac nuclear scan is a test that is done to check the flow of blood to your heart.  Tell your doctor whether you are pregnant or may be pregnant.  Before the test, ask your doctor about changing or stopping your normal medicines. This is important.  Ask your doctor whether you can return to your normal activities. You may be asked to drink more fluids. This information is not intended to replace advice given to you by your health care provider. Make sure you discuss any questions you have with your health care provider. Document Revised: 03/07/2019 Document Reviewed: 05/01/2018 [employer] Patient Education  [place].

## 2019-12-26 NOTE — Progress Notes (Signed)
Cardiology Office Note:    Date:  12/26/2019   ID:  Kurt Wagner, DOB 1961/10/22, MRN [id number]  PCP:  Lillard Anes, MD  Cardiologist:  Berniece Salines, DO  Electrophysiologist:  None   Referring MD: Rochel Brome, MD   The patient is referred by his primary care provider for chest pain.  History of Present Illness:    Kurt Wagner is a 59 y.o. male with a hx of hypertension, hyperlipidemia, tobacco use who presents today at the referral of his PCP evaluated for SVT.  Patient tells me over the last several weeks he has been experiencing midsternal chest dullness which starts from the left side and radiates slightly over his upper left chest.  He notes that he experiences this with exertion and ceases when he is resting.  He denies any shortness of breath, any lightheadedness or dizziness.   Past Medical History:  Diagnosis Date  . Essential (primary) hypertension   . Gastro-esophageal reflux disease with esophagitis   . Intervertebral disc disorders with myelopathy of lumbar region   . Lipoma of back 06/24/2016  . Mixed hyperlipidemia   . Nicotine dependence unspecified, with withdrawal     Past Surgical History:  Procedure Laterality Date  . APPENDECTOMY    . CHEST SURGERY     Tumor inside chest  . HERNIA REPAIR    . LUMBAR DISC SURGERY     X3  . TUMOR EXCISION     From back    Current Medications: Current Meds  Medication Sig  . fexofenadine-pseudoephedrine (ALLEGRA-D) 60-120 MG 12 hr tablet Take 1 tablet by mouth 2 (two) times daily.  Marland Kitchen lisinopril (ZESTRIL) 5 MG tablet Take 5 mg by mouth daily.  . nitroGLYCERIN (NITROSTAT) 0.4 MG SL tablet Place 0.4 mg under the tongue every 5 (five) minutes as needed for chest pain.  Marland Kitchen omeprazole (PRILOSEC OTC) 20 MG tablet Take 20 mg by mouth 2 (two) times daily.  . [DISCONTINUED] metoprolol tartrate (LOPRESSOR) 25 MG tablet Take 25 mg by mouth 2 (two) times daily.     Allergies:   Tape   Social History    Socioeconomic History  . Marital status: Married    Spouse name: Not on file  . Number of children: Not on file  . Years of education: Not on file  . Highest education level: Not on file  Occupational History  . Not on file  Tobacco Use  . Smoking status: Current Every Day Smoker    Types: Cigarettes  . Smokeless tobacco: Former Network engineer and Sexual Activity  . Alcohol use: Yes    Alcohol/week: 12.0 standard drinks    Types: 12 Cans of beer per week  . Drug use: Never  . Sexual activity: Not on file  Other Topics Concern  . Not on file  Social History Narrative  . Not on file   Social Determinants of Health   Financial Resource Strain:   . Difficulty of Paying Living Expenses: Not on file  Food Insecurity:   . Worried About Charity fundraiser in the Last Year: Not on file  . Ran Out of Food in the Last Year: Not on file  Transportation Needs:   . Lack of Transportation (Medical): Not on file  . Lack of Transportation (Non-Medical): Not on file  Physical Activity:   . Days of Exercise per Week: Not on file  . Minutes of Exercise per Session: Not on file  Stress:   . Feeling  of Stress : Not on file  Social Connections:   . Frequency of Communication with Friends and Family: Not on file  . Frequency of Social Gatherings with Friends and Family: Not on file  . Attends Religious Services: Not on file  . Active Member of Clubs or Organizations: Not on file  . Attends Archivist Meetings: Not on file  . Marital Status: Not on file     Family History: The patient's family history includes Arthritis in his mother; Atrial fibrillation in his father; Hypertension in his mother.  ROS:   Review of Systems  Constitution: Negative for decreased appetite, fever and weight gain.  HENT: Negative for congestion, ear discharge, hoarse voice and sore throat.   Eyes: Negative for discharge, redness, vision loss in right eye and visual halos.  Cardiovascular:  Reports chest pain, Negative for dyspnea on exertion, leg swelling, orthopnea and palpitations.  Respiratory: Negative for cough, hemoptysis, shortness of breath and snoring.   Endocrine: Negative for heat intolerance and polyphagia.  Hematologic/Lymphatic: Negative for bleeding problem. Does not bruise/bleed easily.  Skin: Negative for flushing, nail changes, rash and suspicious lesions.  Musculoskeletal: Negative for arthritis, joint pain, muscle cramps, myalgias, neck pain and stiffness.  Gastrointestinal: Negative for abdominal pain, bowel incontinence, diarrhea and excessive appetite.  Genitourinary: Negative for decreased libido, genital sores and incomplete emptying.  Neurological: Negative for brief paralysis, focal weakness, headaches and loss of balance.  Psychiatric/Behavioral: Negative for altered mental status, depression and suicidal ideas.  Allergic/Immunologic: Negative for HIV exposure and persistent infections.    EKGs/Labs/Other Studies Reviewed:    The following studies were reviewed today:   EKG:  The ekg ordered today demonstrates ectopic atrial rhythm, heart rate 77 bpm.  No prior EKG for comparison  Recent Labs: No results found for requested labs within last 8760 hours.  Recent Lipid Panel No results found for: CHOL, TRIG, HDL, CHOLHDL, VLDL, LDLCALC, LDLDIRECT  Physical Exam:    VS:  BP (!) 160/90 (BP Location: Left Arm, Patient Position: Sitting, Cuff Size: Normal)   Pulse 78   Ht 5\' 11"  (1.803 m)   Wt 193 lb (87.5 kg)   SpO2 96%   BMI 26.92 kg/m     Wt Readings from Last 3 Encounters:  12/26/19 193 lb (87.5 kg)  12/20/19 191 lb (86.6 kg)     GEN: Well nourished, well developed in no acute distress HEENT: Normal NECK: No JVD; No carotid bruits LYMPHATICS: No lymphadenopathy CARDIAC: S1S2 noted,RRR, no murmurs, rubs, gallops RESPIRATORY:  Clear to auscultation without rales, wheezing or rhonchi  ABDOMEN: Soft, non-tender, non-distended, +bowel  sounds, no guarding. EXTREMITIES: No edema, No cyanosis, no clubbing MUSCULOSKELETAL:  No deformity  SKIN: Warm and dry NEUROLOGIC:  Alert and oriented x 3, non-focal PSYCHIATRIC:  Normal affect, good insight  ASSESSMENT:    1. Precordial pain   2. Essential hypertension   3. Mixed hyperlipidemia   4. Ectopic atrial rhythm   5. Tobacco use    PLAN:     1.  The patient is experiencing chest discomfort which sounds like his anginal equivalent.  Significant risk factors for coronary artery disease.  Therefore at this time going to pursue an ischemic evaluation.  I have educated patient about pharmacologic nuclear stress test.  He is agreeable to proceed with this testing.  Sublingual nitroglycerin prescription was previously sent by his PCP, its protocol and 911 protocol explained and the patient vocalized understanding questions were answered to the patient's satisfaction.  2.  He is hypertensive in the office today.  He is on lisinopril 5 mg daily I have advised him to continue his lisinopril.  He was started on Lopressor by his PCP but he has not picked up this medication.  I therefore have asked the patient not to get the Lopressor which I will be stopping and starting him on Coreg 6.25 mg twice daily for better blood pressure response.  His target blood pressure is less than 130/80.  He was encouraged to decrease salt intake and increase exercise as tolerated.  3.  Hyperlipidemia-unclear if the patient is on any medications for this.  In the meantime he had lab work done yesterday at his PCP office and requested his records to review his lipid profile.  4.  Ectopic atrial rhythm-appreciated on EKG.  He denies any orders of breath any palpitations lightheadedness or dizziness.  For now we will continue to monitor.  If the need arises in the future we will use ambulatory monitoring devices.  5.  Tobacco use-the patient was counseled on tobacco cessation today for 5 minutes.  Counseling  included reviewing the risks of smoking tobacco products, how it impacts the patient's current medical diagnoses and different strategies for quitting.  Pharmacotherapy to aid in tobacco cessation was not prescribed today. The patient coordinate with  primary care provider.  The patient was also advised to call   1-800-QUIT-NOW 604-160-3661) for additional help with quitting smoking.   The patient is in agreement with the above plan. The patient left the office in stable condition.  The patient will follow up in 1 month or sooner if needed.   Medication Adjustments/Labs and Tests Ordered: Current medicines are reviewed at length with the patient today.  Concerns regarding medicines are outlined above.  Orders Placed This Encounter  Procedures  . MYOCARDIAL PERFUSION IMAGING  . EKG 12-Lead   Meds ordered this encounter  Medications  . carvedilol (COREG) 6.25 MG tablet    Sig: Take 1 tablet (6.25 mg total) by mouth 2 (two) times daily.    Dispense:  180 tablet    Refill:  1    Patient Instructions  Medication Instructions:  Your physician has recommended you make the following change in your medication:   STOP: Lopressor(metoprolol)  START: Coreg(carvedilol) 6.25 mg Take 1 tab twice daily  *If you need a refill on your cardiac medications before your next appointment, please call your pharmacy*  Lab Work: NOne If you have labs (blood work) drawn today and your tests are completely normal, you will receive your results only by: Marland Kitchen MyChart Message (if you have MyChart) OR . A paper copy in the mail If you have any lab test that is abnormal or we need to change your treatment, we will call you to review the results.  Testing/Procedures: Your physician has requested that you have a lexiscan myoview. For further information please visit [email or web address]. Please follow instruction sheet, as given.   Follow-Up: At [facility], you and your health needs are our priority.  As  part of our continuing mission to provide you with exceptional heart care, we have created designated Provider Care Teams.  These Care Teams include your primary Cardiologist (physician) and Advanced Practice Providers (APPs -  Physician Assistants and Nurse Practitioners) who all work together to provide you with the care you need, when you need it.  Your next appointment:   1 month(s)  The format for your next appointment:   In Person  Provider:  Berniece Salines, DO  Other Instructions  Cardiac Nuclear Scan A cardiac nuclear scan is a test that is done to check the flow of blood to your heart. It is done when you are resting and when you are exercising. The test looks for problems such as:  Not enough blood reaching a portion of the heart.  The heart muscle not working as it should. You may need this test if:  You have heart disease.  You have had lab results that are not normal.  You have had heart surgery or a balloon procedure to open up blocked arteries (angioplasty).  You have chest pain.  You have shortness of breath. In this test, a special dye (tracer) is put into your bloodstream. The tracer will travel to your heart. A camera will then take pictures of your heart to see how the tracer moves through your heart. This test is usually done at a hospital and takes 2-4 hours. Tell a doctor about:  Any allergies you have.  All medicines you are taking, including vitamins, herbs, eye drops, creams, and over-the-counter medicines.  Any problems you or family members have had with anesthetic medicines.  Any blood disorders you have.  Any surgeries you have had.  Any medical conditions you have.  Whether you are pregnant or may be pregnant. What are the risks? Generally, this is a safe test. However, problems may occur, such as:  Serious chest pain and heart attack. This is only a risk if the stress portion of the test is done.  Rapid heartbeat.  A feeling of warmth  in your chest. This feeling usually does not last long.  Allergic reaction to the tracer. What happens before the test?  Ask your doctor about changing or stopping your normal medicines. This is important.  Follow instructions from your doctor about what you cannot eat or drink.  Remove your jewelry on the day of the test. What happens during the test?  An IV tube will be inserted into one of your veins.  Your doctor will give you a small amount of tracer through the IV tube.  You will wait for 20-40 minutes while the tracer moves through your bloodstream.  Your heart will be monitored with an electrocardiogram (ECG).  You will lie down on an exam table.  Pictures of your heart will be taken for about 15-20 minutes.  You may also have a stress test. For this test, one of these things may be done: ? You will be asked to exercise on a treadmill or a stationary bike. ? You will be given medicines that will make your heart work harder. This is done if you are unable to exercise.  When blood flow to your heart has peaked, a tracer will again be given through the IV tube.  After 20-40 minutes, you will get back on the exam table. More pictures will be taken of your heart.  Depending on the tracer that is used, more pictures may need to be taken 3-4 hours later.  Your IV tube will be removed when the test is over. The test may vary among doctors and hospitals. What happens after the test?  Ask your doctor: ? Whether you can return to your normal schedule, including diet, activities, and medicines. ? Whether you should drink more fluids. This will help to remove the tracer from your body. Drink enough fluid to keep your pee (urine) pale yellow.  Ask your doctor, or the department that is doing the  test: ? When will my results be ready? ? How will I get my results? Summary  A cardiac nuclear scan is a test that is done to check the flow of blood to your heart.  Tell your  doctor whether you are pregnant or may be pregnant.  Before the test, ask your doctor about changing or stopping your normal medicines. This is important.  Ask your doctor whether you can return to your normal activities. You may be asked to drink more fluids. This information is not intended to replace advice given to you by your health care provider. Make sure you discuss any questions you have with your health care provider. Document Revised: 03/07/2019 Document Reviewed: 05/01/2018 Elsevier Patient Education  [place].      Adopting a Healthy Lifestyle.  Know what a healthy weight is for you (roughly BMI <25) and aim to maintain this   Aim for 7+ servings of fruits and vegetables daily   65-80+ fluid ounces of water or unsweet tea for healthy kidneys   Limit to max 1 drink of alcohol per day; avoid smoking/tobacco   Limit animal fats in diet for cholesterol and heart health - choose grass fed whenever available   Avoid highly processed foods, and foods high in saturated/trans fats   Aim for low stress - take time to unwind and care for your mental health   Aim for 150 min of moderate intensity exercise weekly for heart health, and weights twice weekly for bone health   Aim for 7-9 hours of sleep daily   When it comes to diets, agreement about the perfect plan isnt easy to find, even among the experts. Experts at the [employer] developed an idea known as the Healthy Eating Plate. Just imagine a plate divided into logical, healthy portions.   The emphasis is on diet quality:   Load up on vegetables and fruits - one-half of your plate: Aim for color and variety, and remember that potatoes dont count.   Go for whole grains - one-quarter of your plate: Whole wheat, barley, wheat berries, quinoa, oats, brown rice, and foods made with them. If you want pasta, go with whole wheat pasta.   Protein power - one-quarter of your plate: Fish, chicken,  beans, and nuts are all healthy, versatile protein sources. Limit red meat.   The diet, however, does go beyond the plate, offering a few other suggestions.   Use healthy plant oils, such as olive, canola, soy, corn, sunflower and peanut. Check the labels, and avoid partially hydrogenated oil, which have unhealthy trans fats.   If youre thirsty, drink water. Coffee and tea are good in moderation, but skip sugary drinks and limit milk and dairy products to one or two daily servings.   The type of carbohydrate in the diet is more important than the amount. Some sources of carbohydrates, such as vegetables, fruits, whole grains, and beans-are healthier than others.   Finally, stay active  Signed, Berniece Salines, DO  12/26/2019 9:13 AM    [place]

## 2020-01-07 ENCOUNTER — Telehealth: Payer: Self-pay

## 2020-01-07 NOTE — Telephone Encounter (Signed)
I will call in nexletol

## 2020-01-14 ENCOUNTER — Other Ambulatory Visit: Payer: Self-pay

## 2020-01-14 MED ORDER — NEXLETOL 180 MG PO TABS: 180.0000 mg | ORAL_TABLET | Freq: Every day | ORAL | 6 refills | Status: DC

## 2020-01-30 ENCOUNTER — Telehealth (HOSPITAL_COMMUNITY): Payer: Self-pay | Admitting: *Deleted

## 2020-01-30 NOTE — Telephone Encounter (Signed)
Patient's wife given detailed instructions per Myocardial Perfusion Study Information Sheet for the test on 02/05/20 at 8:15. Patient notified to arrive 15 minutes early and that it is imperative to arrive on time for appointment to keep from having the test rescheduled.  If you need to cancel or reschedule your appointment, please call the office within 24 hours of your appointment. . Patien's wifet verbalized understanding.Veronia Beets

## 2020-02-05 ENCOUNTER — Other Ambulatory Visit: Payer: Self-pay

## 2020-02-05 ENCOUNTER — Telehealth: Payer: Self-pay

## 2020-02-05 ENCOUNTER — Ambulatory Visit (INDEPENDENT_AMBULATORY_CARE_PROVIDER_SITE_OTHER): Payer: Medicare Other

## 2020-02-05 VITALS — Ht 71.0 in | Wt 193.0 lb

## 2020-02-05 DIAGNOSIS — R072 Precordial pain: Secondary | ICD-10-CM

## 2020-02-05 LAB — MYOCARDIAL PERFUSION IMAGING
LV dias vol: 83 mL (ref 62–150)
LV sys vol: 32 mL
Peak HR: 118 {beats}/min
Rest HR: 78 {beats}/min
SDS: 3
SRS: 7
SSS: 10
TID: 1.09

## 2020-02-05 MED ORDER — TECHNETIUM TC 99M TETROFOSMIN IV KIT
10.5000 | PACK | Freq: Once | INTRAVENOUS | Status: AC | PRN
  Administered 2020-02-05: 10.5 via INTRAVENOUS

## 2020-02-05 MED ORDER — REGADENOSON 0.4 MG/5ML IV SOLN
0.4000 mg | Freq: Once | INTRAVENOUS | Status: AC
  Administered 2020-02-05: 0.4 mg via INTRAVENOUS

## 2020-02-05 MED ORDER — TECHNETIUM TC 99M TETROFOSMIN IV KIT
30.1000 | PACK | Freq: Once | INTRAVENOUS | Status: AC | PRN
  Administered 2020-02-05: 30.1 via INTRAVENOUS

## 2020-02-07 ENCOUNTER — Other Ambulatory Visit: Payer: Self-pay

## 2020-02-07 ENCOUNTER — Ambulatory Visit (INDEPENDENT_AMBULATORY_CARE_PROVIDER_SITE_OTHER): Payer: Medicare Other | Admitting: Cardiology

## 2020-02-07 ENCOUNTER — Encounter: Payer: Self-pay | Admitting: Cardiology

## 2020-02-07 VITALS — BP 140/100 | HR 91 | Ht 71.0 in | Wt 193.8 lb

## 2020-02-07 DIAGNOSIS — E782 Mixed hyperlipidemia: Secondary | ICD-10-CM | POA: Diagnosis not present

## 2020-02-07 DIAGNOSIS — Z720 Tobacco use: Secondary | ICD-10-CM | POA: Diagnosis not present

## 2020-02-07 DIAGNOSIS — R072 Precordial pain: Secondary | ICD-10-CM | POA: Diagnosis not present

## 2020-02-07 DIAGNOSIS — I10 Essential (primary) hypertension: Secondary | ICD-10-CM

## 2020-02-07 MED ORDER — EZETIMIBE 10 MG PO TABS: 10.0000 mg | ORAL_TABLET | Freq: Every day | ORAL | 1 refills | Status: DC

## 2020-02-07 MED ORDER — LISINOPRIL 10 MG PO TABS: 10.0000 mg | ORAL_TABLET | Freq: Every day | ORAL | 1 refills | Status: DC

## 2020-02-07 NOTE — Patient Instructions (Signed)
Medication Instructions:  Your physician has recommended you make the following change in your medication:   STOP:Nexetol  START: Zetia 10 mg Take 1 tab daily  INCREASE: Lisinopril to 10 mg Take 1 tab daily  *If you need a refill on your cardiac medications before your next appointment, please call your pharmacy*   Lab Work: None If you have labs (blood work) drawn today and your tests are completely normal, you will receive your results only by: Marland Kitchen MyChart Message (if you have MyChart) OR . A paper copy in the mail If you have any lab test that is abnormal or we need to change your treatment, we will call you to review the results.   Testing/Procedures: nOne   Follow-Up: At [facility], you and your health needs are our priority.  As part of our continuing mission to provide you with exceptional heart care, we have created designated Provider Care Teams.  These Care Teams include your primary Cardiologist (physician) and Advanced Practice Providers (APPs -  Physician Assistants and Nurse Practitioners) who all work together to provide you with the care you need, when you need it.  We recommend signing up for the patient portal called "MyChart".  Sign up information is provided on this After Visit Summary.  MyChart is used to connect with patients for Virtual Visits (Telemedicine).  Patients are able to view lab/test results, encounter notes, upcoming appointments, etc.  Non-urgent messages can be sent to your provider as well.   To learn more about what you can do with MyChart, go to [email or web address].    Your next appointment:   3 month(s)  The format for your next appointment:   In Person  Provider:   Berniece Salines, DO   Other Instructions

## 2020-02-07 NOTE — Progress Notes (Signed)
Cardiology Office Note:    Date:  02/07/2020   ID:  Kurt Wagner, DOB 1961-11-19, MRN [id number]  PCP:  Lillard Anes, MD  Cardiologist:  Berniece Salines, DO  Electrophysiologist:  None   Referring MD: Lillard Anes,*   Follow up hypertension  History of Present Illness:    Kurt Wagner is a 59 y.o. male with a hx of hyperlipidemia, tobacco use and hypertension presented initially on December 26, 2019 at that time he was hypertensive. I started patient on carvedilol 6.25 mg twice a day.  Also continue his lisinopril 5 mg daily.  His chest pain pharmacologic stress test was also ordered.  Meantime the patient was able to take his stress test.  Continue to take his antihypertensive medicine.  He is here for follow-up visit.  He tells me that he has had some intermittent chest pain but presents before.  Denies any shortness of breath.    Past Medical History:  Diagnosis Date  . Essential (primary) hypertension   . Gastro-esophageal reflux disease with esophagitis   . Intervertebral disc disorders with myelopathy of lumbar region   . Lipoma of back 06/24/2016  . Mixed hyperlipidemia   . Nicotine dependence unspecified, with withdrawal     Past Surgical History:  Procedure Laterality Date  . APPENDECTOMY    . CHEST SURGERY     Tumor inside chest  . HERNIA REPAIR    . LUMBAR DISC SURGERY     X3  . TUMOR EXCISION     From back    Current Medications: Current Meds  Medication Sig  . carvedilol (COREG) 6.25 MG tablet Take 1 tablet (6.25 mg total) by mouth 2 (two) times daily.  . fexofenadine-pseudoephedrine (ALLEGRA-D) 60-120 MG 12 hr tablet Take 1 tablet by mouth 2 (two) times daily.  . nitroGLYCERIN (NITROSTAT) 0.4 MG SL tablet Place 0.4 mg under the tongue every 5 (five) minutes as needed for chest pain.  Marland Kitchen omeprazole (PRILOSEC OTC) 20 MG tablet Take 20 mg by mouth 2 (two) times daily.  . [DISCONTINUED] Bempedoic Acid (NEXLETOL) 180 MG TABS Take 180 mg  by mouth daily.  . [DISCONTINUED] lisinopril (ZESTRIL) 5 MG tablet Take 5 mg by mouth daily.     Allergies:   Tape   Social History   Socioeconomic History  . Marital status: Married    Spouse name: Not on file  . Number of children: Not on file  . Years of education: Not on file  . Highest education level: Not on file  Occupational History  . Not on file  Tobacco Use  . Smoking status: Current Every Day Smoker    Types: Cigarettes  . Smokeless tobacco: Former Network engineer and Sexual Activity  . Alcohol use: Yes    Alcohol/week: 12.0 standard drinks    Types: 12 Cans of beer per week  . Drug use: Never  . Sexual activity: Not on file  Other Topics Concern  . Not on file  Social History Narrative  . Not on file   Social Determinants of Health   Financial Resource Strain:   . Difficulty of Paying Living Expenses:   Food Insecurity:   . Worried About Charity fundraiser in the Last Year:   . Arboriculturist in the Last Year:   Transportation Needs:   . Film/video editor (Medical):   Marland Kitchen Lack of Transportation (Non-Medical):   Physical Activity:   . Days of Exercise per Week:   .  Minutes of Exercise per Session:   Stress:   . Feeling of Stress :   Social Connections:   . Frequency of Communication with Friends and Family:   . Frequency of Social Gatherings with Friends and Family:   . Attends Religious Services:   . Active Member of Clubs or Organizations:   . Attends Archivist Meetings:   Marland Kitchen Marital Status:      Family History: The patient's family history includes Arthritis in his mother; Atrial fibrillation in his father; Hypertension in his mother.  ROS:   Review of Systems  Constitution: Negative for decreased appetite, fever and weight gain.  HENT: Negative for congestion, ear discharge, hoarse voice and sore throat.   Eyes: Negative for discharge, redness, vision loss in right eye and visual halos.  Cardiovascular: Negative for chest  pain, dyspnea on exertion, leg swelling, orthopnea and palpitations.  Respiratory: Negative for cough, hemoptysis, shortness of breath and snoring.   Endocrine: Negative for heat intolerance and polyphagia.  Hematologic/Lymphatic: Negative for bleeding problem. Does not bruise/bleed easily.  Skin: Negative for flushing, nail changes, rash and suspicious lesions.  Musculoskeletal: Negative for arthritis, joint pain, muscle cramps, myalgias, neck pain and stiffness.  Gastrointestinal: Negative for abdominal pain, bowel incontinence, diarrhea and excessive appetite.  Genitourinary: Negative for decreased libido, genital sores and incomplete emptying.  Neurological: Negative for brief paralysis, focal weakness, headaches and loss of balance.  Psychiatric/Behavioral: Negative for altered mental status, depression and suicidal ideas.  Allergic/Immunologic: Negative for HIV exposure and persistent infections.    EKGs/Labs/Other Studies Reviewed:    The following studies were reviewed today:   EKG: None today  Pharmalogical stress    The left ventricular ejection fraction is normal (55-65%).  Nuclear stress EF: 62%.  There was no ST segment deviation noted during stress.  Defect 1: There is a small defect of mild severity present in the basal inferior and mid inferior location.  NO evidence of ischemia or scar  Normal EF.  Recent Labs: No results found for requested labs within last 8760 hours.  Recent Lipid Panel No results found for: CHOL, TRIG, HDL, CHOLHDL, VLDL, LDLCALC, LDLDIRECT  Physical Exam:    VS:  BP (!) 140/100   Pulse 91   Ht 5\' 11"  (1.803 m)   Wt 193 lb 12.8 oz (87.9 kg)   SpO2 98%   BMI 27.03 kg/m     Wt Readings from Last 3 Encounters:  02/07/20 193 lb 12.8 oz (87.9 kg)  02/05/20 193 lb (87.5 kg)  12/26/19 193 lb (87.5 kg)     GEN: Well nourished, well developed in no acute distress HEENT: Normal NECK: No JVD; No carotid bruits LYMPHATICS: No  lymphadenopathy CARDIAC: S1S2 noted,RRR, no murmurs, rubs, gallops RESPIRATORY:  Clear to auscultation without rales, wheezing or rhonchi  ABDOMEN: Soft, non-tender, non-distended, +bowel sounds, no guarding. EXTREMITIES: No edema, No cyanosis, no clubbing MUSCULOSKELETAL:  No deformity  SKIN: Warm and dry NEUROLOGIC:  Alert and oriented x 3, non-focal PSYCHIATRIC:  Normal affect, good insight  ASSESSMENT:    1. Essential hypertension   2. Mixed hyperlipidemia   3. Tobacco use   4. Precordial pain    PLAN:     1.  His blood pressure is improved but not at target.  I am going to increase his lisinopril up to 10 milligrams daily.  He will continue on his carvedilol 6.25 mg twice daily.  He was advised to continue to use walker today.  Decreased intake.  2.  Hyperlipidemia.  The patient is statin intolerant.  He was started on bempedoic acid by his primary care doctor but this medicine is not affordable for the patient.  I discussed with him he said he has had many troubles with statin drugs and will prefer not to be on a statin.  He tells me once he runs out of his medicine he is not going to take it again because of the cost.  At this time I am going to start him on Zetia 10 mg daily hoping to get some relief.  He may be a candidate for PCSK9 inhibitor.  He has lab work scheduled PCP office I have requested lipid profile as well as LP(a) at the time he took his lab work done.  3.  In terms of his chest pain he did his stress test this week which reported normal.  Neck pain is improving we will continue to monitor him.  4.  Tobacco use-the patient was counseled on tobacco cessation today for 5 minutes.  Counseling included reviewing the risks of smoking tobacco products, how it impacts the patient's current medical diagnoses and different strategies for quitting.  Pharmacotherapy to aid in tobacco cessation was not prescribed today. The patient coordinate with  primary care provider.  The  patient was also advised to call   1-800-QUIT-NOW (616)413-5748) for additional help with quitting smoking.    The patient is in agreement with the above plan. The patient left the office in stable condition.  The patient will follow up in 3 months or sooner if needed.   Medication Adjustments/Labs and Tests Ordered: Current medicines are reviewed at length with the patient today.  Concerns regarding medicines are outlined above.  No orders of the defined types were placed in this encounter.  Meds ordered this encounter  Medications  . lisinopril (ZESTRIL) 10 MG tablet    Sig: Take 1 tablet (10 mg total) by mouth daily.    Dispense:  90 tablet    Refill:  1  . ezetimibe (ZETIA) 10 MG tablet    Sig: Take 1 tablet (10 mg total) by mouth daily.    Dispense:  90 tablet    Refill:  1    Patient Instructions  Medication Instructions:  Your physician has recommended you make the following change in your medication:   STOP:Nexetol  START: Zetia 10 mg Take 1 tab daily  INCREASE: Lisinopril to 10 mg Take 1 tab daily  *If you need a refill on your cardiac medications before your next appointment, please call your pharmacy*   Lab Work: None If you have labs (blood work) drawn today and your tests are completely normal, you will receive your results only by: Marland Kitchen MyChart Message (if you have MyChart) OR . A paper copy in the mail If you have any lab test that is abnormal or we need to change your treatment, we will call you to review the results.   Testing/Procedures: nOne   Follow-Up: At [facility], you and your health needs are our priority.  As part of our continuing mission to provide you with exceptional heart care, we have created designated Provider Care Teams.  These Care Teams include your primary Cardiologist (physician) and Advanced Practice Providers (APPs -  Physician Assistants and Nurse Practitioners) who all work together to provide you with the care you need,  when you need it.  We recommend signing up for the patient portal called "MyChart".  Sign up information is provided  on this After Visit Summary.  MyChart is used to connect with patients for Virtual Visits (Telemedicine).  Patients are able to view lab/test results, encounter notes, upcoming appointments, etc.  Non-urgent messages can be sent to your provider as well.   To learn more about what you can do with MyChart, go to [email or web address].    Your next appointment:   3 month(s)  The format for your next appointment:   In Person  Provider:   Berniece Salines, DO   Other Instructions      Adopting a Healthy Lifestyle.  Know what a healthy weight is for you (roughly BMI <25) and aim to maintain this   Aim for 7+ servings of fruits and vegetables daily   65-80+ fluid ounces of water or unsweet tea for healthy kidneys   Limit to max 1 drink of alcohol per day; avoid smoking/tobacco   Limit animal fats in diet for cholesterol and heart health - choose grass fed whenever available   Avoid highly processed foods, and foods high in saturated/trans fats   Aim for low stress - take time to unwind and care for your mental health   Aim for 150 min of moderate intensity exercise weekly for heart health, and weights twice weekly for bone health   Aim for 7-9 hours of sleep daily   When it comes to diets, agreement about the perfect plan isnt easy to find, even among the experts. Experts at the [place] [employer] developed an idea known as the Healthy Eating Plate. Just imagine a plate divided into logical, healthy portions.   The emphasis is on diet quality:   Load up on vegetables and fruits - one-half of your plate: Aim for color and variety, and remember that potatoes dont count.   Go for whole grains - one-quarter of your plate: Whole wheat, barley, wheat berries, quinoa, oats, brown rice, and foods made with them. If you want pasta, go with whole wheat  pasta.   Protein power - one-quarter of your plate: Fish, chicken, beans, and nuts are all healthy, versatile protein sources. Limit red meat.   The diet, however, does go beyond the plate, offering a few other suggestions.   Use healthy plant oils, such as olive, canola, soy, corn, sunflower and peanut. Check the labels, and avoid partially hydrogenated oil, which have unhealthy trans fats.   If youre thirsty, drink water. Coffee and tea are good in moderation, but skip sugary drinks and limit milk and dairy products to one or two daily servings.   The type of carbohydrate in the diet is more important than the amount. Some sources of carbohydrates, such as vegetables, fruits, whole grains, and beans-are healthier than others.   Finally, stay active  Signed, Berniece Salines, DO  02/07/2020 11:28 AM    [facility]

## 2020-02-11 NOTE — Telephone Encounter (Signed)
Close encounter 

## 2020-05-09 ENCOUNTER — Ambulatory Visit: Payer: Medicare Other | Admitting: Cardiology

## 2020-07-21 ENCOUNTER — Telehealth: Payer: Self-pay

## 2020-07-21 ENCOUNTER — Other Ambulatory Visit: Payer: Self-pay

## 2020-07-21 ENCOUNTER — Other Ambulatory Visit: Payer: Self-pay | Admitting: Legal Medicine

## 2020-07-21 DIAGNOSIS — M545 Low back pain, unspecified: Secondary | ICD-10-CM

## 2020-07-21 MED ORDER — PREDNISONE 10 MG (21) PO TBPK: ORAL_TABLET | ORAL | 0 refills | Status: DC

## 2020-07-21 NOTE — Progress Notes (Unsigned)
rednisone

## 2020-07-21 NOTE — Telephone Encounter (Signed)
Patient called in today requesting a prednisone pac for back trouble,

## 2020-11-11 ENCOUNTER — Other Ambulatory Visit: Payer: Self-pay | Admitting: Cardiology

## 2021-01-07 ENCOUNTER — Encounter: Payer: Self-pay | Admitting: Legal Medicine

## 2021-01-07 ENCOUNTER — Ambulatory Visit (INDEPENDENT_AMBULATORY_CARE_PROVIDER_SITE_OTHER): Payer: Medicare Other | Admitting: Legal Medicine

## 2021-01-07 ENCOUNTER — Other Ambulatory Visit: Payer: Self-pay

## 2021-01-07 VITALS — BP 132/92 | HR 115 | Temp 96.8°F | Ht 71.0 in | Wt 199.0 lb

## 2021-01-07 DIAGNOSIS — I10 Essential (primary) hypertension: Secondary | ICD-10-CM

## 2021-01-07 DIAGNOSIS — E782 Mixed hyperlipidemia: Secondary | ICD-10-CM | POA: Diagnosis not present

## 2021-01-07 DIAGNOSIS — Z720 Tobacco use: Secondary | ICD-10-CM | POA: Diagnosis not present

## 2021-01-07 DIAGNOSIS — R072 Precordial pain: Secondary | ICD-10-CM | POA: Diagnosis not present

## 2021-01-07 MED ORDER — LISINOPRIL 20 MG PO TABS: 10.0000 mg | ORAL_TABLET | Freq: Every day | ORAL | 2 refills | Status: DC

## 2021-01-07 MED ORDER — NITROGLYCERIN 0.4 MG SL SUBL: 0.4000 mg | SUBLINGUAL_TABLET | SUBLINGUAL | 6 refills | Status: DC | PRN

## 2021-01-07 NOTE — Progress Notes (Signed)
Subjective:  Patient ID: Kurt Wagner, male    DOB: [birth year]  Age: 60 y.o. MRN: [id number]  Chief Complaint  Patient presents with  . Hypertension  . Hyperlipidemia    HPI: chronic visit  Patient presents for follow up of hypertension.  Patient tolerating coreg, lisinopril well with side effects.  Patient was diagnosed with hypertension 2010 so has been treated for hypertension for 10 years.Patient is working on maintaining diet and exercise regimen and follows up as directed. Complication include none.  Patient presents with hyperlipidemia.  Compliance with treatment has been good; patient takes medicines as directed, maintains low cholesterol diet, follows up as directed, and maintains exercise regimen.  Patient is using zetia without problems.  Patient has chest pains relieved by NTG, normal lexiscan   Current Outpatient Medications on File Prior to Visit  Medication Sig Dispense Refill  . carvedilol (COREG) 6.25 MG tablet Take 1 tablet (6.25 mg total) by mouth 2 (two) times daily. 180 tablet 1  . ezetimibe (ZETIA) 10 MG tablet Take 1 tablet (10 mg total) by mouth daily. 90 tablet 1  . fexofenadine-pseudoephedrine (ALLEGRA-D) 60-120 MG 12 hr tablet Take 1 tablet by mouth 2 (two) times daily.    Marland Kitchen omeprazole (PRILOSEC OTC) 20 MG tablet Take 20 mg by mouth 2 (two) times daily.     No current facility-administered medications on file prior to visit.   Past Medical History:  Diagnosis Date  . Ectopic atrial rhythm 12/26/2019  . Essential (primary) hypertension   . Essential hypertension 12/26/2019  . Gastro-esophageal reflux disease with esophagitis   . Intervertebral disc disorders with myelopathy of lumbar region   . Lipoma of back 06/24/2016  . Mixed hyperlipidemia   . Nicotine dependence unspecified, with withdrawal   . Precordial pain 12/26/2019  . Tobacco use 12/26/2019   Past Surgical History:  Procedure Laterality Date  . APPENDECTOMY    . CHEST SURGERY     Tumor  inside chest  . HERNIA REPAIR    . LUMBAR DISC SURGERY     X3  . TUMOR EXCISION     From back    Family History  Problem Relation Age of Onset  . Hypertension Mother   . Arthritis Mother   . Atrial fibrillation Father    Social History   Socioeconomic History  . Marital status: Married    Spouse name: Not on file  . Number of children: Not on file  . Years of education: Not on file  . Highest education level: Not on file  Occupational History  . Not on file  Tobacco Use  . Smoking status: Current Every Day Smoker    Types: Cigarettes  . Smokeless tobacco: Former Network engineer  . Vaping Use: Never used  Substance and Sexual Activity  . Alcohol use: Yes    Alcohol/week: 12.0 standard drinks    Types: 12 Cans of beer per week  . Drug use: Never  . Sexual activity: Not on file  Other Topics Concern  . Not on file  Social History Narrative  . Not on file   Social Determinants of Health   Financial Resource Strain: Not on file  Food Insecurity: Not on file  Transportation Needs: Not on file  Physical Activity: Not on file  Stress: Not on file  Social Connections: Not on file    Review of Systems  Constitutional: Negative for activity change and unexpected weight change.  HENT: Negative for congestion and sinus pain.  Eyes: Negative for visual disturbance.  Respiratory: Negative for apnea, chest tightness and shortness of breath.   Cardiovascular: Negative for chest pain, palpitations and leg swelling.  Gastrointestinal: Negative for abdominal distention and abdominal pain.  Endocrine: Negative for polyuria.  Genitourinary: Negative for difficulty urinating, dysuria and urgency.  Musculoskeletal: Negative for arthralgias and back pain.  Skin: Negative.   Neurological: Negative.   Psychiatric/Behavioral: Negative.      Objective:  BP (!) 132/92   Pulse (!) 115   Temp (!) 96.8 F (36 C)   Ht 5\' 11"  (1.803 m)   Wt 199 lb (90.3 kg)   SpO2 98%   BMI  27.75 kg/m   BP/Weight 01/07/2021 02/07/2020 6/75/9163  Systolic BP 846 659 935  Diastolic BP 92 701 90  Wt. (Lbs) 199 193.8 193  BMI 27.75 27.03 26.92    Physical Exam Vitals reviewed.  Constitutional:      Appearance: Normal appearance.  HENT:     Head: Normocephalic and atraumatic.     Right Ear: Tympanic membrane, ear canal and external ear normal.     Left Ear: Tympanic membrane, ear canal and external ear normal.     Mouth/Throat:     Mouth: Mucous membranes are moist.     Pharynx: Oropharynx is clear.  Eyes:     Extraocular Movements: Extraocular movements intact.     Conjunctiva/sclera: Conjunctivae normal.     Pupils: Pupils are equal, round, and reactive to light.  Cardiovascular:     Rate and Rhythm: Normal rate and regular rhythm.     Pulses: Normal pulses.     Heart sounds: Normal heart sounds. No murmur heard. No gallop.   Pulmonary:     Effort: Pulmonary effort is normal. No respiratory distress.     Breath sounds: Normal breath sounds. No rales.  Abdominal:     General: Abdomen is flat. Bowel sounds are normal. There is no distension.     Palpations: Abdomen is soft.     Tenderness: There is no abdominal tenderness.  Musculoskeletal:        General: No tenderness. Normal range of motion.     Cervical back: Normal range of motion and neck supple.  Skin:    General: Skin is warm and dry.     Capillary Refill: Capillary refill takes less than 2 seconds.  Neurological:     General: No focal deficit present.     Mental Status: He is alert and oriented to person, place, and time. Mental status is at baseline.  Psychiatric:        Mood and Affect: Mood normal.        Thought Content: Thought content normal.       No results found for: WBC, HGB, HCT, PLT, GLUCOSE, CHOL, TRIG, HDL, LDLDIRECT, LDLCALC, ALT, AST, NA, K, CL, CREATININE, BUN, CO2, TSH, PSA, INR, GLUF, HGBA1C, MICROALBUR    Assessment & Plan:   Diagnoses and all orders for this  visit: Precordial pain -     nitroGLYCERIN (NITROSTAT) 0.4 MG SL tablet; Place 1 tablet (0.4 mg total) under the tongue every 5 (five) minutes as needed for chest pain. Patient continues to see cardiology  Essential hypertension -     CBC with Differential/Platelet -     Comprehensive metabolic panel -     lisinopril (ZESTRIL) 20 MG tablet; Take 0.5 tablets (10 mg total) by mouth daily. An individual hypertension care plan was established and reinforced today.  The patient's status was  assessed using clinical findings on exam and labs or diagnostic tests. The patient's success at meeting treatment goals on disease specific evidence-based guidelines and found to be fair controlled. SELF MANAGEMENT: The patient and I together assessed ways to personally work towards obtaining the recommended goals. RECOMMENDATIONS: avoid decongestants found in common cold remedies, decrease consumption of alcohol, perform routine monitoring of BP with home BP cuff, exercise, reduction of dietary salt, take medicines as prescribed, try not to miss doses and quit smoking.  Regular exercise and maintaining a healthy weight is needed.  Stress reduction may help. A CLINICAL SUMMARY including written plan identify barriers to care unique to individual due to social or financial issues.  We attempt to mutually creat solutions for individual and family understanding.  Mixed hyperlipidemia -     Lipid panel AN INDIVIDUAL CARE PLAN for hyperlipidemia/ cholesterol was established and reinforced today.  The patient's status was assessed using clinical findings on exam, lab and other diagnostic tests. The patient's disease status was assessed based on evidence-based guidelines and found to be well controlled. MEDICATIONS were reviewed. SELF MANAGEMENT GOALS have been discussed and patient's success at attaining the goal of low cholesterol was assessed. RECOMMENDATION given include regular exercise 3 days a week and low  cholesterol/low fat diet. CLINICAL SUMMARY including written plan to identify barriers unique to the patient due to social or economic  reasons was discussed.  Tobacco use Individual plan was given to patient based on exam, history and other tests and using evidence based criteria for care for smoking cessation.  We discussed behavioral changes to help cessation and offered counseling to aid in quitting.  Medical consequences of tobacco use were explained.    Meds ordered this encounter  Medications  . nitroGLYCERIN (NITROSTAT) 0.4 MG SL tablet    Sig: Place 1 tablet (0.4 mg total) under the tongue every 5 (five) minutes as needed for chest pain.    Dispense:  25 tablet    Refill:  6  . lisinopril (ZESTRIL) 20 MG tablet    Sig: Take 0.5 tablets (10 mg total) by mouth daily.    Dispense:  90 tablet    Refill:  2    Orders Placed This Encounter  Procedures  . CBC with Differential/Platelet  . Comprehensive metabolic panel  . Lipid panel      I spent 30 minutes dedicated to the care of this patient on the date of this encounter to include face-to-face time with the patient, as well as: review records  Follow-up: Return in about 6 months (around 07/07/2021) for fasting.  An After Visit Summary was printed and given to the patient.  Reinaldo Meeker, MD [facility] [phone]

## 2021-03-23 DIAGNOSIS — H66009 Acute suppurative otitis media without spontaneous rupture of ear drum, unspecified ear: Secondary | ICD-10-CM | POA: Diagnosis not present

## 2021-03-23 DIAGNOSIS — M79642 Pain in left hand: Secondary | ICD-10-CM | POA: Diagnosis not present

## 2021-09-22 ENCOUNTER — Ambulatory Visit: Payer: Medicare Other | Admitting: Legal Medicine

## 2021-09-28 ENCOUNTER — Other Ambulatory Visit: Payer: Self-pay

## 2021-09-28 ENCOUNTER — Ambulatory Visit (INDEPENDENT_AMBULATORY_CARE_PROVIDER_SITE_OTHER): Payer: Medicare Other | Admitting: Legal Medicine

## 2021-09-28 ENCOUNTER — Encounter: Payer: Self-pay | Admitting: Legal Medicine

## 2021-09-28 VITALS — BP 140/90 | HR 100 | Temp 98.3°F | Resp 16 | Wt 182.0 lb

## 2021-09-28 DIAGNOSIS — E782 Mixed hyperlipidemia: Secondary | ICD-10-CM

## 2021-09-28 DIAGNOSIS — Z720 Tobacco use: Secondary | ICD-10-CM

## 2021-09-28 DIAGNOSIS — I10 Essential (primary) hypertension: Secondary | ICD-10-CM | POA: Diagnosis not present

## 2021-09-28 NOTE — Progress Notes (Signed)
Acute Office Visit  Subjective:    Patient ID: Kurt Wagner, male    DOB: 1960-12-22, 60 y.o.   MRN: [id number]  Chief Complaint  Patient presents with   Dark spot on the skin    HPI: Patient is in today for dark spots on the skin. His wife noticed and he wants to be checked. He also mentioned some blood blister when he bumps things.His wife found them.  Patient presents for follow up of hypertension.  Patient tolerating coreg, lisinopril well with side effects.  Patient was diagnosed with hypertension 2010 so has been treated for hypertension for 10 years.Patient is working on maintaining diet and exercise regimen and follows up as directed. Complication include none.   Patient presents with hyperlipidemia.  Compliance with treatment has been good; patient takes medicines as directed, maintains low cholesterol diet, follows up as directed, and maintains exercise regimen.  Patient is using zetoia without problems. s  Past Medical History:  Diagnosis Date   Ectopic atrial rhythm 12/26/2019   Essential (primary) hypertension    Essential hypertension 12/26/2019   Gastro-esophageal reflux disease with esophagitis    Intervertebral disc disorders with myelopathy of lumbar region    Lipoma of back 06/24/2016   Mixed hyperlipidemia    Nicotine dependence unspecified, with withdrawal    Precordial pain 12/26/2019   Tobacco use 12/26/2019    Past Surgical History:  Procedure Laterality Date   APPENDECTOMY     CHEST SURGERY     Tumor inside chest   HERNIA REPAIR     LUMBAR DISC SURGERY     X3   TUMOR EXCISION     From back    Family History  Problem Relation Age of Onset   Hypertension Mother    Arthritis Mother    Atrial fibrillation Father     Social History   Socioeconomic History   Marital status: Married    Spouse name: Not on file   Number of children: Not on file   Years of education: Not on file   Highest education level: Not on file  Occupational History    Not on file  Tobacco Use   Smoking status: Every Day    Packs/day: 1.00    Types: Cigarettes   Smokeless tobacco: Former  Scientific laboratory technician Use: Never used  Substance and Sexual Activity   Alcohol use: Yes    Alcohol/week: 12.0 standard drinks    Types: 12 Cans of beer per week   Drug use: Never   Sexual activity: Yes    Partners: Female  Other Topics Concern   Not on file  Social History Narrative   Not on file   Social Determinants of Health   Financial Resource Strain: Not on file  Food Insecurity: Not on file  Transportation Needs: Not on file  Physical Activity: Not on file  Stress: Not on file  Social Connections: Not on file  Intimate Partner Violence: Not on file    Outpatient Medications Prior to Visit  Medication Sig Dispense Refill   ibuprofen (ADVIL) 200 MG tablet Take 200 mg by mouth 2 (two) times daily.     lisinopril (ZESTRIL) 20 MG tablet Take 0.5 tablets (10 mg total) by mouth daily. 90 tablet 2   nitroGLYCERIN (NITROSTAT) 0.4 MG SL tablet Place 1 tablet (0.4 mg total) under the tongue every 5 (five) minutes as needed for chest pain. 25 tablet 6   omeprazole (PRILOSEC OTC) 20 MG tablet Take  20 mg by mouth 2 (two) times daily.     carvedilol (COREG) 6.25 MG tablet Take 1 tablet (6.25 mg total) by mouth 2 (two) times daily. (Patient not taking: Reported on 09/28/2021) 180 tablet 1   ezetimibe (ZETIA) 10 MG tablet Take 1 tablet (10 mg total) by mouth daily. 90 tablet 1   fexofenadine-pseudoephedrine (ALLEGRA-D) 60-120 MG 12 hr tablet Take 1 tablet by mouth 2 (two) times daily.     No facility-administered medications prior to visit.    Allergies  Allergen Reactions   Tape Other (See Comments)    Blisters    Review of Systems  Constitutional:  Negative for chills, fatigue, fever and unexpected weight change.  HENT:  Negative for congestion, ear pain, sinus pain and sore throat.   Respiratory:  Negative for cough and shortness of breath.    Cardiovascular:  Negative for chest pain and palpitations.  Gastrointestinal:  Negative for abdominal pain, blood in stool, constipation, diarrhea, nausea and vomiting.  Endocrine: Negative for polydipsia.  Genitourinary:  Negative for dysuria.  Musculoskeletal:  Negative for back pain.  Skin:  Negative for rash.  Neurological:  Negative for headaches.  Psychiatric/Behavioral: Negative.        Objective:    Physical Exam Vitals reviewed.  Constitutional:      General: He is not in acute distress.    Appearance: Normal appearance.  HENT:     Right Ear: Tympanic membrane, ear canal and external ear normal.     Left Ear: Tympanic membrane, ear canal and external ear normal.     Mouth/Throat:     Mouth: Mucous membranes are moist.     Pharynx: Oropharynx is clear.  Eyes:     Extraocular Movements: Extraocular movements intact.     Conjunctiva/sclera: Conjunctivae normal.     Pupils: Pupils are equal, round, and reactive to light.  Cardiovascular:     Rate and Rhythm: Normal rate and regular rhythm.     Pulses: Normal pulses.     Heart sounds: Normal heart sounds. No murmur heard.   No gallop.  Pulmonary:     Effort: Pulmonary effort is normal. No respiratory distress.     Breath sounds: No wheezing.  Abdominal:     General: Abdomen is flat. Bowel sounds are normal. There is no distension.     Tenderness: There is no abdominal tenderness.  Musculoskeletal:     Cervical back: Normal range of motion.  Skin:    General: Skin is warm.     Capillary Refill: Capillary refill takes less than 2 seconds.          Comments: Scratches rom dog, one area of seborrhoic keratosis left forearm, no lesion of back, chronic sun changes  Neurological:     General: No focal deficit present.     Mental Status: He is alert and oriented to person, place, and time. Mental status is at baseline.    BP 140/90   Pulse 100   Temp 98.3 F (36.8 C)   Resp 16   Wt 182 lb (82.6 kg)   SpO2 97%    BMI 25.38 kg/m  Wt Readings from Last 3 Encounters:  09/28/21 182 lb (82.6 kg)  01/07/21 199 lb (90.3 kg)  02/07/20 193 lb 12.8 oz (87.9 kg)    Health Maintenance Due  Topic Date Due   HIV Screening  Never done   Hepatitis C Screening  Never done   TETANUS/TDAP  Never done   COLONOSCOPY (Pts  45-38yr Insurance coverage will need to be confirmed)  Never done   Zoster Vaccines- Shingrix (1 of 2) Never done    There are no preventive care reminders to display for this patient.   No results found for: TSH No results found for: WBC, HGB, HCT, MCV, PLT No results found for: NA, K, CHLORIDE, CO2, GLUCOSE, BUN, CREATININE, BILITOT, ALKPHOS, AST, ALT, PROT, ALBUMIN, CALCIUM, ANIONGAP, EGFR, GFR No results found for: CHOL No results found for: HDL No results found for: LDLCALC No results found for: TRIG No results found for: CHOLHDL No results found for: HGBA1C     Assessment & Plan:   Problem List Items Addressed This Visit       Cardiovascular and Mediastinum   Essential hypertension - Primary   Relevant Orders   CBC with Differential/Platelet   Comprehensive metabolic panel An individual hypertension care plan was established and reinforced today.  The patient's status was assessed using clinical findings on exam and labs or diagnostic tests. The patient's success at meeting treatment goals on disease specific evidence-based guidelines and found to be well controlled. SELF MANAGEMENT: The patient and I together assessed ways to personally work towards obtaining the recommended goals. RECOMMENDATIONS: avoid decongestants found in common cold remedies, decrease consumption of alcohol, perform routine monitoring of BP with home BP cuff, exercise, reduction of dietary salt, take medicines as prescribed, try not to miss doses and quit smoking.  Regular exercise and maintaining a healthy weight is needed.  Stress reduction may help. A CLINICAL SUMMARY including written plan identify  barriers to care unique to individual due to social or financial issues.  We attempt to mutually creat solutions for individual and family understanding.      Other   Mixed hyperlipidemia   Relevant Orders   Lipid panel AN INDIVIDUAL CARE PLAN for hyperlipidemia/ cholesterol was established and reinforced today.  The patient's status was assessed using clinical findings on exam, lab and other diagnostic tests. The patient's disease status was assessed based on evidence-based guidelines and found to be well controlled. MEDICATIONS were review. SELF MANAGEMENT GOALS have been discussed and patient's success at attaining the goal of low cholesterol was assessed. RECOMMENDATION given include regular exercise 3 days a week and low cholesterol/low fat diet. CLINICAL SUMMARY including written plan to identify barriers unique to the patient due to social or economic  reasons was discussed.    Tobacco use Individual plan was given to patient based on exam, history and other tests and using evidence based criteria for care for smoking cessation.  We discussed behavioral changes to help cessation and offered counseling to aid in quitting.  Medical consequences of tobacco use were explained.       Orders Placed This Encounter  Procedures   CBC with Differential/Platelet   Comprehensive metabolic panel   Lipid panel      Follow-up: Return in about 6 months (around 03/28/2022) for fasting.  An After Visit Summary was printed and given to the patient.  LReinaldo Meeker MD [facility] [phone]

## 2021-09-29 LAB — CBC WITH DIFFERENTIAL/PLATELET
Basophils Absolute: 0.1 10*3/uL (ref 0.0–0.2)
Basos: 1 %
EOS (ABSOLUTE): 0.1 10*3/uL (ref 0.0–0.4)
Eos: 1 %
Hematocrit: 51 % (ref 37.5–51.0)
Hemoglobin: 17.7 g/dL (ref 13.0–17.7)
Immature Grans (Abs): 0 10*3/uL (ref 0.0–0.1)
Immature Granulocytes: 0 %
Lymphocytes Absolute: 1.6 10*3/uL (ref 0.7–3.1)
Lymphs: 24 %
MCH: 32.1 pg (ref 26.6–33.0)
MCHC: 34.7 g/dL (ref 31.5–35.7)
MCV: 93 fL (ref 79–97)
Monocytes Absolute: 0.5 10*3/uL (ref 0.1–0.9)
Monocytes: 7 %
Neutrophils Absolute: 4.4 10*3/uL (ref 1.4–7.0)
Neutrophils: 67 %
Platelets: 201 10*3/uL (ref 150–450)
RBC: 5.51 x10E6/uL (ref 4.14–5.80)
RDW: 12.8 % (ref 11.6–15.4)
WBC: 6.5 10*3/uL (ref 3.4–10.8)

## 2021-09-29 LAB — COMPREHENSIVE METABOLIC PANEL
ALT: 18 IU/L (ref 0–44)
AST: 17 IU/L (ref 0–40)
Albumin/Globulin Ratio: 2 (ref 1.2–2.2)
Albumin: 4.7 g/dL (ref 3.8–4.9)
Alkaline Phosphatase: 80 IU/L (ref 44–121)
BUN/Creatinine Ratio: 14 (ref 9–20)
BUN: 13 mg/dL (ref 6–24)
Bilirubin Total: <0.2 mg/dL (ref 0.0–1.2)
CO2: 20 mmol/L (ref 20–29)
Calcium: 9.3 mg/dL (ref 8.7–10.2)
Chloride: 102 mmol/L (ref 96–106)
Creatinine, Ser: 0.95 mg/dL (ref 0.76–1.27)
Globulin, Total: 2.4 g/dL (ref 1.5–4.5)
Glucose: 92 mg/dL (ref 70–99)
Potassium: 4.6 mmol/L (ref 3.5–5.2)
Sodium: 137 mmol/L (ref 134–144)
Total Protein: 7.1 g/dL (ref 6.0–8.5)
eGFR: 92 mL/min/{1.73_m2} (ref >59)

## 2021-09-29 LAB — LIPID PANEL
Chol/HDL Ratio: 5 ratio (ref 0.0–5.0)
Cholesterol, Total: 234 mg/dL — ABNORMAL HIGH (ref 100–199)
HDL: 47 mg/dL (ref >39)
LDL Chol Calc (NIH): 170 mg/dL — ABNORMAL HIGH (ref 0–99)
Triglycerides: 94 mg/dL (ref 0–149)
VLDL Cholesterol Cal: 17 mg/dL (ref 5–40)

## 2021-09-29 LAB — CARDIOVASCULAR RISK ASSESSMENT

## 2021-09-29 NOTE — Progress Notes (Signed)
Cbc normal, kidney and liver tests normal, lsl cholesterol 170 very high zetia not helping much, may try injectacle medicines,  lp

## 2022-02-18 ENCOUNTER — Other Ambulatory Visit: Payer: Self-pay

## 2022-02-18 ENCOUNTER — Ambulatory Visit (INDEPENDENT_AMBULATORY_CARE_PROVIDER_SITE_OTHER): Payer: Medicare Other | Admitting: Legal Medicine

## 2022-02-18 DIAGNOSIS — I959 Hypotension, unspecified: Secondary | ICD-10-CM | POA: Diagnosis not present

## 2022-02-18 DIAGNOSIS — M5412 Radiculopathy, cervical region: Secondary | ICD-10-CM | POA: Insufficient documentation

## 2022-02-18 DIAGNOSIS — M47812 Spondylosis without myelopathy or radiculopathy, cervical region: Secondary | ICD-10-CM | POA: Diagnosis not present

## 2022-02-18 DIAGNOSIS — T402X5A Adverse effect of other opioids, initial encounter: Secondary | ICD-10-CM | POA: Diagnosis not present

## 2022-02-18 DIAGNOSIS — E860 Dehydration: Secondary | ICD-10-CM | POA: Diagnosis not present

## 2022-02-18 DIAGNOSIS — R7989 Other specified abnormal findings of blood chemistry: Secondary | ICD-10-CM | POA: Diagnosis not present

## 2022-02-18 DIAGNOSIS — R55 Syncope and collapse: Secondary | ICD-10-CM | POA: Diagnosis not present

## 2022-02-18 DIAGNOSIS — J439 Emphysema, unspecified: Secondary | ICD-10-CM | POA: Diagnosis not present

## 2022-02-18 DIAGNOSIS — R61 Generalized hyperhidrosis: Secondary | ICD-10-CM | POA: Diagnosis not present

## 2022-02-18 MED ORDER — CYCLOBENZAPRINE HCL 10 MG PO TABS: 10.0000 mg | ORAL_TABLET | Freq: Three times a day (TID) | ORAL | 0 refills | Status: DC | PRN

## 2022-02-18 MED ORDER — CARVEDILOL 6.25 MG PO TABS: 6.2500 mg | ORAL_TABLET | Freq: Two times a day (BID) | ORAL | 1 refills | Status: DC

## 2022-02-18 MED ORDER — HYDROCODONE-ACETAMINOPHEN 10-325 MG PO TABS: 1.0000 | ORAL_TABLET | Freq: Three times a day (TID) | ORAL | 0 refills | Status: AC | PRN

## 2022-02-18 MED ORDER — EZETIMIBE 10 MG PO TABS: 10.0000 mg | ORAL_TABLET | Freq: Every day | ORAL | 1 refills | Status: DC

## 2022-02-18 MED ORDER — PREDNISONE 10 MG (21) PO TBPK: ORAL_TABLET | ORAL | 0 refills | Status: DC

## 2022-02-18 NOTE — Progress Notes (Signed)
? ?Subjective:  ?Patient ID: Kurt Wagner, male    DOB: Dec 20, 1960  Age: 61 y.o. MRN: [id number] ? ?Chief Complaint  ?Patient presents with  ? Neck Pain  ? ? ?HPI: acute: he is disabled from back ? ?Patient has throbbing pain on his neck with radiation to both shoulder and back but not down arms. ROM is very limited. This started 2 days ago. He tried ibuprofen  and muscle relaxant and they did not help anymore. He would like to have a referral to neurosurgery. He had workup in [facility]. Pain level 10/10. ? ?He had lumbar surgery 2013 in [place] and subsequent steroids. ? ? ?  ?Current Outpatient Medications on File Prior to Visit  ?Medication Sig Dispense Refill  ? ibuprofen (ADVIL) 200 MG tablet Take 200 mg by mouth 2 (two) times daily.    ? lisinopril (ZESTRIL) 20 MG tablet Take 0.5 tablets (10 mg total) by mouth daily. 90 tablet 2  ? nitroGLYCERIN (NITROSTAT) 0.4 MG SL tablet Place 1 tablet (0.4 mg total) under the tongue every 5 (five) minutes as needed for chest pain. 25 tablet 6  ? omeprazole (PRILOSEC OTC) 20 MG tablet Take 20 mg by mouth 2 (two) times daily.    ? ?No current facility-administered medications on file prior to visit.  ? ?Past Medical History:  ?Diagnosis Date  ? Ectopic atrial rhythm 12/26/2019  ? Essential (primary) hypertension   ? Essential hypertension 12/26/2019  ? Gastro-esophageal reflux disease with esophagitis   ? Intervertebral disc disorders with myelopathy of lumbar region   ? Lipoma of back 06/24/2016  ? Mixed hyperlipidemia   ? Nicotine dependence unspecified, with withdrawal   ? Precordial pain 12/26/2019  ? Tobacco use 12/26/2019  ? ?Past Surgical History:  ?Procedure Laterality Date  ? APPENDECTOMY    ? CHEST SURGERY    ? Tumor inside chest  ? HERNIA REPAIR    ? LUMBAR DISC SURGERY    ? X3  ? TUMOR EXCISION    ? From back  ?  ?Family History  ?Problem Relation Age of Onset  ? Hypertension Mother   ? Arthritis Mother   ? Atrial fibrillation Father   ? ?Social History   ? ?Socioeconomic History  ? Marital status: Married  ?  Spouse name: Not on file  ? Number of children: Not on file  ? Years of education: Not on file  ? Highest education level: Not on file  ?Occupational History  ? Not on file  ?Tobacco Use  ? Smoking status: Every Day  ?  Packs/day: 1.00  ?  Types: Cigarettes  ? Smokeless tobacco: Former  ?Vaping Use  ? Vaping Use: Never used  ?Substance and Sexual Activity  ? Alcohol use: Yes  ?  Alcohol/week: 12.0 standard drinks  ?  Types: 12 Cans of beer per week  ? Drug use: Never  ? Sexual activity: Yes  ?  Partners: Female  ?Other Topics Concern  ? Not on file  ?Social History Narrative  ? Not on file  ? ?Social Determinants of Health  ? ?Financial Resource Strain: Not on file  ?Food Insecurity: Not on file  ?Transportation Needs: Not on file  ?Physical Activity: Not on file  ?Stress: Not on file  ?Social Connections: Not on file  ? ? ?Review of Systems  ?Constitutional:  Negative for chills, fatigue, fever and unexpected weight change.  ?HENT:  Negative for congestion, ear pain, sinus pain and sore throat.   ?Respiratory:  Negative for cough and shortness of breath.   ?Cardiovascular:  Negative for chest pain and palpitations.  ?Gastrointestinal:  Positive for constipation (Ibuprofen side effect). Negative for abdominal pain, blood in stool, diarrhea, nausea and vomiting.  ?Endocrine: Negative for polydipsia.  ?Genitourinary:  Negative for dysuria.  ?Musculoskeletal:  Positive for arthralgias (shoulders pain), back pain and neck pain.  ?Skin:  Negative for rash.  ?Neurological:  Negative for headaches.  ? ? ?Objective:  ?BP 140/76   Pulse (!) 120   Temp 97.9 ?F (36.6 ?C)   Resp 15   Ht '5\' 11"'$  (1.803 m)   Wt 169 lb (76.7 kg)   SpO2 97%   BMI 23.57 kg/m?  ? ? ?  02/18/2022  ?  1:52 PM 09/28/2021  ? 10:27 AM 01/07/2021  ?  7:37 AM  ?BP/Weight  ?Systolic BP 768 115 726  ?Diastolic BP 76 90 92  ?Wt. (Lbs) 169 182 199  ?BMI 23.57 kg/m2 25.38 kg/m2 27.75 kg/m2   ? ? ?Physical Exam ?Vitals reviewed.  ?Constitutional:   ?   Appearance: Normal appearance.  ?HENT:  ?   Right Ear: Tympanic membrane normal.  ?   Left Ear: Tympanic membrane normal.  ?   Mouth/Throat:  ?   Mouth: Mucous membranes are moist.  ?   Pharynx: Oropharynx is clear.  ?Eyes:  ?   Extraocular Movements: Extraocular movements intact.  ?   Conjunctiva/sclera: Conjunctivae normal.  ?   Pupils: Pupils are equal, round, and reactive to light.  ?Neck:  ?   Comments: Flexion 20 degrees, extension 10 degrees, rotation 20 degrees, negative Lhermitte ?Cardiovascular:  ?   Rate and Rhythm: Normal rate and regular rhythm.  ?   Pulses: Normal pulses.  ?   Heart sounds: Normal heart sounds.  ?Pulmonary:  ?   Effort: Pulmonary effort is normal. No respiratory distress.  ?   Breath sounds: Normal breath sounds. No wheezing.  ?Abdominal:  ?   General: Abdomen is flat. Bowel sounds are normal. There is no distension.  ?   Tenderness: There is no abdominal tenderness.  ?Musculoskeletal:     ?   General: Tenderness present.  ?   Cervical back: Tenderness present.  ?   Comments: Spasm in perispinal muscles and left trapezoid with trigger areas, no neurologic deficits UE  ?Skin: ?   General: Skin is warm.  ?   Capillary Refill: Capillary refill takes less than 2 seconds.  ?Neurological:  ?   General: No focal deficit present.  ?   Mental Status: He is alert and oriented to person, place, and time. Mental status is at baseline.  ? ? ?Diabetic Foot Exam - Simple   ?No data filed ?  ?  ? ?Lab Results  ?Component Value Date  ? WBC 6.5 09/28/2021  ? HGB 17.7 09/28/2021  ? HCT 51.0 09/28/2021  ? PLT 201 09/28/2021  ? GLUCOSE 92 09/28/2021  ? CHOL 234 (H) 09/28/2021  ? TRIG 94 09/28/2021  ? HDL 47 09/28/2021  ? LDLCALC 170 (H) 09/28/2021  ? ALT 18 09/28/2021  ? AST 17 09/28/2021  ? NA 137 09/28/2021  ? K 4.6 09/28/2021  ? CL 102 09/28/2021  ? CREATININE 0.95 09/28/2021  ? BUN 13 09/28/2021  ? CO2 20 09/28/2021  ? ? ? ? ?Assessment &  Plan:  ? ?Problem List Items Addressed This Visit   ? ?  ? Musculoskeletal and Integument  ? Cervical spondylosis  ? Relevant Medications  ?  HYDROcodone-acetaminophen (NORCO) 10-325 MG tablet  ? predniSONE (STERAPRED UNI-PAK 21 TAB) 10 MG (21) TBPK tablet  ? cyclobenzaprine (FLEXERIL) 10 MG tablet  ? Other Relevant Orders  ? Ambulatory referral to Neurology  ? DG Cervical Spine Complete ?Patient has severe neck pain and muscle spasms in the paraspinal muscles he has had lower back pain surgery in the past and was seeing [place] neurolog[facility].  We will treat him with some hydrocodone prednisone pack and Flexeril he is to have his wife the size of the tender and muscle spasm in his back which may ease some of his pain to his neck.  X-ray of his neck was ordered.  ?. ? ?Meds ordered this encounter  ?Medications  ? HYDROcodone-acetaminophen (NORCO) 10-325 MG tablet  ?  Sig: Take 1 tablet by mouth every 8 (eight) hours as needed for up to 5 days.  ?  Dispense:  15 tablet  ?  Refill:  0  ? predniSONE (STERAPRED UNI-PAK 21 TAB) 10 MG (21) TBPK tablet  ?  Sig: Take 6ills first day , then 5 pills day 2 and then cut down one pill day until gone  ?  Dispense:  21 tablet  ?  Refill:  0  ? cyclobenzaprine (FLEXERIL) 10 MG tablet  ?  Sig: Take 1 tablet (10 mg total) by mouth 3 (three) times daily as needed for muscle spasms.  ?  Dispense:  30 tablet  ?  Refill:  0  ? ? ?Orders Placed This Encounter  ?Procedures  ? DG Cervical Spine Complete  ? Ambulatory referral to Neurology  ?  ? ?Follow-up: Return in about 2 weeks (around 03/04/2022) for neck. ? ?An After Visit Summary was printed and given to the patient. ? ?Reinaldo Meeker, MD ?[place] ?[phone]

## 2022-02-19 ENCOUNTER — Encounter: Payer: Self-pay | Admitting: Legal Medicine

## 2022-02-19 DIAGNOSIS — R7989 Other specified abnormal findings of blood chemistry: Secondary | ICD-10-CM | POA: Diagnosis not present

## 2022-02-19 DIAGNOSIS — M47812 Spondylosis without myelopathy or radiculopathy, cervical region: Secondary | ICD-10-CM | POA: Diagnosis not present

## 2022-04-29 ENCOUNTER — Encounter: Payer: Self-pay | Admitting: Gastroenterology

## 2022-05-08 DIAGNOSIS — I252 Old myocardial infarction: Secondary | ICD-10-CM | POA: Insufficient documentation

## 2022-05-12 DIAGNOSIS — E7800 Pure hypercholesterolemia, unspecified: Secondary | ICD-10-CM | POA: Diagnosis not present

## 2022-05-12 DIAGNOSIS — I10 Essential (primary) hypertension: Secondary | ICD-10-CM | POA: Diagnosis not present

## 2022-05-12 DIAGNOSIS — Z955 Presence of coronary angioplasty implant and graft: Secondary | ICD-10-CM | POA: Diagnosis not present

## 2022-05-12 DIAGNOSIS — I2102 ST elevation (STEMI) myocardial infarction involving left anterior descending coronary artery: Secondary | ICD-10-CM | POA: Insufficient documentation

## 2022-05-12 DIAGNOSIS — Z716 Tobacco abuse counseling: Secondary | ICD-10-CM | POA: Diagnosis not present

## 2022-05-12 DIAGNOSIS — I213 ST elevation (STEMI) myocardial infarction of unspecified site: Secondary | ICD-10-CM | POA: Diagnosis not present

## 2022-05-12 DIAGNOSIS — R9431 Abnormal electrocardiogram [ECG] [EKG]: Secondary | ICD-10-CM | POA: Diagnosis not present

## 2022-05-12 DIAGNOSIS — I249 Acute ischemic heart disease, unspecified: Secondary | ICD-10-CM | POA: Diagnosis not present

## 2022-05-12 DIAGNOSIS — F17210 Nicotine dependence, cigarettes, uncomplicated: Secondary | ICD-10-CM | POA: Diagnosis not present

## 2022-05-12 DIAGNOSIS — Z7982 Long term (current) use of aspirin: Secondary | ICD-10-CM | POA: Diagnosis not present

## 2022-05-12 DIAGNOSIS — I259 Chronic ischemic heart disease, unspecified: Secondary | ICD-10-CM | POA: Diagnosis not present

## 2022-05-12 DIAGNOSIS — J449 Chronic obstructive pulmonary disease, unspecified: Secondary | ICD-10-CM | POA: Diagnosis not present

## 2022-05-12 DIAGNOSIS — Z79899 Other long term (current) drug therapy: Secondary | ICD-10-CM | POA: Diagnosis not present

## 2022-05-12 DIAGNOSIS — Z720 Tobacco use: Secondary | ICD-10-CM | POA: Diagnosis not present

## 2022-05-12 DIAGNOSIS — I25110 Atherosclerotic heart disease of native coronary artery with unstable angina pectoris: Secondary | ICD-10-CM | POA: Insufficient documentation

## 2022-05-12 DIAGNOSIS — R0789 Other chest pain: Secondary | ICD-10-CM | POA: Diagnosis not present

## 2022-05-12 DIAGNOSIS — F419 Anxiety disorder, unspecified: Secondary | ICD-10-CM | POA: Diagnosis not present

## 2022-05-12 DIAGNOSIS — R001 Bradycardia, unspecified: Secondary | ICD-10-CM | POA: Diagnosis not present

## 2022-05-12 DIAGNOSIS — F17200 Nicotine dependence, unspecified, uncomplicated: Secondary | ICD-10-CM | POA: Diagnosis not present

## 2022-05-12 DIAGNOSIS — R079 Chest pain, unspecified: Secondary | ICD-10-CM | POA: Diagnosis not present

## 2022-05-12 DIAGNOSIS — I2510 Atherosclerotic heart disease of native coronary artery without angina pectoris: Secondary | ICD-10-CM | POA: Diagnosis not present

## 2022-05-12 DIAGNOSIS — E785 Hyperlipidemia, unspecified: Secondary | ICD-10-CM | POA: Diagnosis not present

## 2022-05-12 HISTORY — DX: Presence of coronary angioplasty implant and graft: Z95.5

## 2022-05-12 HISTORY — DX: ST elevation (STEMI) myocardial infarction involving left anterior descending coronary artery: I21.02

## 2022-05-12 HISTORY — PX: CORONARY STENT PLACEMENT: SHX1402

## 2022-05-14 ENCOUNTER — Other Ambulatory Visit: Payer: Self-pay | Admitting: Legal Medicine

## 2022-05-14 DIAGNOSIS — I10 Essential (primary) hypertension: Secondary | ICD-10-CM

## 2022-05-24 ENCOUNTER — Encounter: Payer: Self-pay | Admitting: Nurse Practitioner

## 2022-05-24 ENCOUNTER — Other Ambulatory Visit: Payer: Self-pay | Admitting: Nurse Practitioner

## 2022-05-24 ENCOUNTER — Ambulatory Visit (INDEPENDENT_AMBULATORY_CARE_PROVIDER_SITE_OTHER): Payer: Medicare Other | Admitting: Nurse Practitioner

## 2022-05-24 VITALS — BP 102/60 | HR 84 | Temp 97.3°F | Ht 71.0 in | Wt 156.6 lb

## 2022-05-24 DIAGNOSIS — Z9189 Other specified personal risk factors, not elsewhere classified: Secondary | ICD-10-CM | POA: Diagnosis not present

## 2022-05-24 DIAGNOSIS — I10 Essential (primary) hypertension: Secondary | ICD-10-CM

## 2022-05-24 DIAGNOSIS — Z955 Presence of coronary angioplasty implant and graft: Secondary | ICD-10-CM | POA: Diagnosis not present

## 2022-05-24 DIAGNOSIS — Z9889 Other specified postprocedural states: Secondary | ICD-10-CM | POA: Diagnosis not present

## 2022-05-24 DIAGNOSIS — E782 Mixed hyperlipidemia: Secondary | ICD-10-CM

## 2022-05-24 DIAGNOSIS — I2102 ST elevation (STEMI) myocardial infarction involving left anterior descending coronary artery: Secondary | ICD-10-CM

## 2022-05-24 MED ORDER — EZETIMIBE 10 MG PO TABS: 10.0000 mg | ORAL_TABLET | Freq: Every day | ORAL | 0 refills | Status: DC

## 2022-05-25 LAB — CBC WITH DIFFERENTIAL/PLATELET
Basophils Absolute: 0.1 10*3/uL (ref 0.0–0.2)
Basos: 1 %
EOS (ABSOLUTE): 0.1 10*3/uL (ref 0.0–0.4)
Eos: 1 %
Hematocrit: 48.4 % (ref 37.5–51.0)
Hemoglobin: 16.7 g/dL (ref 13.0–17.7)
Immature Grans (Abs): 0 10*3/uL (ref 0.0–0.1)
Immature Granulocytes: 0 %
Lymphocytes Absolute: 1.6 10*3/uL (ref 0.7–3.1)
Lymphs: 27 %
MCH: 31.5 pg (ref 26.6–33.0)
MCHC: 34.5 g/dL (ref 31.5–35.7)
MCV: 91 fL (ref 79–97)
Monocytes Absolute: 0.5 10*3/uL (ref 0.1–0.9)
Monocytes: 8 %
Neutrophils Absolute: 3.9 10*3/uL (ref 1.4–7.0)
Neutrophils: 63 %
Platelets: 165 10*3/uL (ref 150–450)
RBC: 5.3 x10E6/uL (ref 4.14–5.80)
RDW: 12.4 % (ref 11.6–15.4)
WBC: 6.1 10*3/uL (ref 3.4–10.8)

## 2022-05-25 LAB — COMPREHENSIVE METABOLIC PANEL
ALT: 21 IU/L (ref 0–44)
AST: 18 IU/L (ref 0–40)
Albumin/Globulin Ratio: 2 (ref 1.2–2.2)
Albumin: 4.5 g/dL (ref 3.8–4.9)
Alkaline Phosphatase: 66 IU/L (ref 44–121)
BUN/Creatinine Ratio: 10 (ref 9–20)
BUN: 9 mg/dL (ref 6–24)
Bilirubin Total: 0.4 mg/dL (ref 0.0–1.2)
CO2: 19 mmol/L — ABNORMAL LOW (ref 20–29)
Calcium: 9.3 mg/dL (ref 8.7–10.2)
Chloride: 100 mmol/L (ref 96–106)
Creatinine, Ser: 0.94 mg/dL (ref 0.76–1.27)
Globulin, Total: 2.3 g/dL (ref 1.5–4.5)
Glucose: 89 mg/dL (ref 70–99)
Potassium: 4.1 mmol/L (ref 3.5–5.2)
Sodium: 138 mmol/L (ref 134–144)
Total Protein: 6.8 g/dL (ref 6.0–8.5)
eGFR: 93 mL/min/{1.73_m2} (ref >59)

## 2022-05-26 ENCOUNTER — Inpatient Hospital Stay: Payer: Medicare Other | Admitting: Legal Medicine

## 2022-05-31 DIAGNOSIS — I25110 Atherosclerotic heart disease of native coronary artery with unstable angina pectoris: Secondary | ICD-10-CM | POA: Diagnosis not present

## 2022-05-31 DIAGNOSIS — I10 Essential (primary) hypertension: Secondary | ICD-10-CM | POA: Diagnosis not present

## 2022-05-31 DIAGNOSIS — I2102 ST elevation (STEMI) myocardial infarction involving left anterior descending coronary artery: Secondary | ICD-10-CM | POA: Diagnosis not present

## 2022-05-31 DIAGNOSIS — K219 Gastro-esophageal reflux disease without esophagitis: Secondary | ICD-10-CM | POA: Diagnosis not present

## 2022-05-31 DIAGNOSIS — Z955 Presence of coronary angioplasty implant and graft: Secondary | ICD-10-CM | POA: Diagnosis not present

## 2022-05-31 DIAGNOSIS — E7800 Pure hypercholesterolemia, unspecified: Secondary | ICD-10-CM | POA: Diagnosis not present

## 2022-06-16 DIAGNOSIS — M25511 Pain in right shoulder: Secondary | ICD-10-CM | POA: Diagnosis not present

## 2022-06-16 DIAGNOSIS — M542 Cervicalgia: Secondary | ICD-10-CM | POA: Diagnosis not present

## 2022-06-16 DIAGNOSIS — M545 Low back pain, unspecified: Secondary | ICD-10-CM | POA: Diagnosis not present

## 2022-07-24 DIAGNOSIS — R519 Headache, unspecified: Secondary | ICD-10-CM | POA: Diagnosis not present

## 2022-07-24 DIAGNOSIS — Z5321 Procedure and treatment not carried out due to patient leaving prior to being seen by health care provider: Secondary | ICD-10-CM | POA: Diagnosis not present

## 2022-07-28 ENCOUNTER — Ambulatory Visit (INDEPENDENT_AMBULATORY_CARE_PROVIDER_SITE_OTHER): Payer: Medicare Other | Admitting: Physician Assistant

## 2022-07-28 ENCOUNTER — Encounter: Payer: Self-pay | Admitting: Physician Assistant

## 2022-07-28 VITALS — BP 108/60 | HR 70 | Temp 97.3°F | Ht 71.0 in | Wt 153.0 lb

## 2022-07-28 DIAGNOSIS — G4452 New daily persistent headache (NDPH): Secondary | ICD-10-CM | POA: Diagnosis not present

## 2022-07-28 DIAGNOSIS — M792 Neuralgia and neuritis, unspecified: Secondary | ICD-10-CM | POA: Diagnosis not present

## 2022-07-28 DIAGNOSIS — R5381 Other malaise: Secondary | ICD-10-CM

## 2022-07-28 DIAGNOSIS — R519 Headache, unspecified: Secondary | ICD-10-CM | POA: Diagnosis not present

## 2022-07-28 MED ORDER — PREDNISONE 20 MG PO TABS: ORAL_TABLET | ORAL | 0 refills | Status: AC

## 2022-07-28 NOTE — Progress Notes (Signed)
Acute Office Visit  Subjective:    Patient ID: Kurt Wagner, male    DOB: 1961/06/29, 61 y.o.   MRN: [id number]  Chief Complaint  Patient presents with   Headache    HPI: Patient is in today for complaints of new daily headache.  He states that about 10 days ago he began having headache behind his right eye and pain extends toward right temple but mostly to top of his head.  He says that if he touches his forehead and top of head it almost feels bruised and is sore.  He describes as a sharp jabbing pain.  He had 1 and 1/2 days where headache was gone but then recurred.  He denies fever, nausea, vomiting, fatigue and no vision changes. He states that many years ago he was diagnosed with migraines but cannot remember what type of medication he used He recently was diagnosed with CAD and had stent placement - he was advised to not use ibuprofen while on plavix so has taken tylenol which didn't help a lot and he has some hydrocodone (that he used for neck pain in past) which he states at times has helped relieve pain but not resolve pain  Past Medical History:  Diagnosis Date   Ectopic atrial rhythm 12/26/2019   Essential (primary) hypertension    Essential hypertension 12/26/2019   Gastro-esophageal reflux disease with esophagitis    Intervertebral disc disorders with myelopathy of lumbar region    Lipoma of back 06/24/2016   Mixed hyperlipidemia    Nicotine dependence unspecified, with withdrawal    Precordial pain 12/26/2019   Tobacco use 12/26/2019    Past Surgical History:  Procedure Laterality Date   APPENDECTOMY     CHEST SURGERY     Tumor inside chest   CORONARY STENT PLACEMENT  05/12/2022   Xience Skypoint 3.0 mm x 33 mmCSN: [id number] [id number] REF: [id number], LOT [id number]   HERNIA REPAIR     LUMBAR DISC SURGERY     X3   TUMOR EXCISION     From back    Family History  Problem Relation Age of Onset   Hypertension Mother    Arthritis Mother     Atrial fibrillation Father     Social History   Socioeconomic History   Marital status: Married    Spouse name: Not on file   Number of children: Not on file   Years of education: Not on file   Highest education level: Not on file  Occupational History   Not on file  Tobacco Use   Smoking status: Former    Packs/day: 1.00    Years: 49.00    Total pack years: 49.00    Types: Cigarettes    Quit date: 05/24/2022    Years since quitting: 0.1   Smokeless tobacco: Former  Scientific laboratory technician Use: Never used  Substance and Sexual Activity   Alcohol use: Yes    Alcohol/week: 12.0 standard drinks of alcohol    Types: 12 Cans of beer per week   Drug use: Never   Sexual activity: Yes    Partners: Female  Other Topics Concern   Not on file  Social History Narrative   Not on file   Social Determinants of Health   Financial Resource Strain: Not on file  Food Insecurity: Not on file  Transportation Needs: Not on file  Physical Activity: Not on file  Stress: Not on file  Social Connections: Not on file  Intimate  Partner Violence: Not on file    Outpatient Medications Prior to Visit  Medication Sig Dispense Refill   aspirin EC 81 MG tablet Take 1 tablet by mouth daily.     atorvastatin (LIPITOR) 80 MG tablet Take 80 mg by mouth daily.     clopidogrel (PLAVIX) 75 MG tablet Take by mouth.     ezetimibe (ZETIA) 10 MG tablet Take 1 tablet (10 mg total) by mouth daily. 90 tablet 0   HYDROcodone-acetaminophen (NORCO) 10-325 MG tablet Take by mouth.     lisinopril (ZESTRIL) 20 MG tablet TAKE 1/2 TABLET (10 MG) BY MOUTH ONCE DAILY. 90 tablet 2   nitroGLYCERIN (NITROSTAT) 0.4 MG SL tablet Place 1 tablet (0.4 mg total) under the tongue every 5 (five) minutes as needed for chest pain. 25 tablet 6   pantoprazole (PROTONIX) 40 MG tablet Take 1 tablet by mouth daily.     cyclobenzaprine (FLEXERIL) 10 MG tablet Take 1 tablet (10 mg total) by mouth 3 (three) times daily as needed for muscle  spasms. 30 tablet 0   ibuprofen (ADVIL) 200 MG tablet Take 200 mg by mouth 2 (two) times daily.     prasugrel (EFFIENT) 10 MG TABS tablet Take 10 mg by mouth daily.     No facility-administered medications prior to visit.    Allergies  Allergen Reactions   Azithromycin Other (See Comments)    unknown   Fentanyl Other (See Comments)    Headache   Simvastatin Other (See Comments)    Joint pain   Tape Other (See Comments)    Blisters   Varenicline Other (See Comments)    unknown    Review of Systems CONSTITUTIONAL: Negative for chills, fatigue, fever,  E/N/T: Negative for ear pain, nasal congestion and sore throat.  CARDIOVASCULAR: Negative for chest pain, dizziness, palpitations  RESPIRATORY: Negative for recent cough and dyspnea.  GASTROINTESTINAL: Negative for abdominal pain, acid reflux symptoms, constipation, diarrhea, nausea and vomiting.  MSK: Negative for arthralgias and myalgias.  INTEGUMENTARY: Negative for rash.  NEUROLOGICAL: see HPI         Objective:   PHYSICAL EXAM:   VS: BP 108/60 (BP Location: Left Arm, Patient Position: Sitting, Cuff Size: Normal)   Pulse 70   Temp (!) 97.3 F (36.3 C) (Temporal)   Ht '5\' 11"'  (1.803 m)   Wt 153 lb (69.4 kg)   SpO2 97%   BMI 21.34 kg/m   GEN: Well nourished, well developed, in no acute distress  HEENT: normal external ears and nose - normal external auditory canals and TMS - hearing grossly normal - - Lips, Teeth and Gums - normal  Oropharynx - normal mucosa, palate, and posterior pharynx Neck: no JVD or masses - no thyromegaly Cardiac: RRR; no murmurs, rubs, or gallops,no edema -  Respiratory:  normal respiratory rate and pattern with no distress - normal breath sounds with no rales, rhonchi, wheezes or rubs Skin: warm and dry, no rash  Neuro:  Alert and Oriented x 3, - CN II-Xii grossly intact Psych: euthymic mood, appropriate affect and demeanor    Health Maintenance Due  Topic Date Due   TETANUS/TDAP   Never done   COLONOSCOPY (Pts 45-16yr Insurance coverage will need to be confirmed)  Never done   Zoster Vaccines- Shingrix (1 of 2) Never done   INFLUENZA VACCINE  06/29/2022    There are no preventive care reminders to display for this patient.   No results found for: "TSH" Lab Results  Component Value  Date   WBC 6.1 05/24/2022   HGB 16.7 05/24/2022   HCT 48.4 05/24/2022   MCV 91 05/24/2022   PLT 165 05/24/2022   Lab Results  Component Value Date   NA 138 05/24/2022   K 4.1 05/24/2022   CO2 19 (L) 05/24/2022   GLUCOSE 89 05/24/2022   BUN 9 05/24/2022   CREATININE 0.94 05/24/2022   BILITOT 0.4 05/24/2022   ALKPHOS 66 05/24/2022   AST 18 05/24/2022   ALT 21 05/24/2022   PROT 6.8 05/24/2022   ALBUMIN 4.5 05/24/2022   CALCIUM 9.3 05/24/2022   EGFR 93 05/24/2022   Lab Results  Component Value Date   CHOL 234 (H) 09/28/2021   Lab Results  Component Value Date   HDL 47 09/28/2021   Lab Results  Component Value Date   LDLCALC 170 (H) 09/28/2021   Lab Results  Component Value Date   TRIG 94 09/28/2021   Lab Results  Component Value Date   CHOLHDL 5.0 09/28/2021   No results found for: "HGBA1C"     Assessment & Plan:   Problem List Items Addressed This Visit       Other   New persistent daily headache - Primary   Relevant Medications   predniSONE (DELTASONE) 20 MG tablet   Other Relevant Orders   CT HEAD WO CONTRAST (5MM)   CBC with Differential/Platelet   Comprehensive metabolic panel   TSH   Sedimentation rate   Neuralgia   Relevant Medications   predniSONE (DELTASONE) 20 MG tablet   Other Relevant Orders   CBC with Differential/Platelet   Comprehensive metabolic panel   TSH   Sedimentation rate   Malaise   Relevant Orders   CBC with Differential/Platelet   Comprehensive metabolic panel   TSH   Sedimentation rate   Meds ordered this encounter  Medications   predniSONE (DELTASONE) 20 MG tablet    Sig: Take 3 tablets (60 mg total) by  mouth daily with breakfast for 3 days, THEN 2 tablets (40 mg total) daily with breakfast for 3 days, THEN 1 tablet (20 mg total) daily with breakfast for 3 days.    Dispense:  18 tablet    Refill:  0    Order Specific Question:   Supervising Provider    AnswerShelton Silvas    Orders Placed This Encounter  Procedures   CT HEAD WO CONTRAST (5MM)   CBC with Differential/Platelet   Comprehensive metabolic panel   TSH   Sedimentation rate     Follow-up: Return if symptoms worsen or fail to improve. - Head CT was normal Will try prednisone taper for neuralgia type headache  An After Visit Summary was printed and given to the patient.  Yetta Flock [facility] [phone]

## 2022-07-29 ENCOUNTER — Other Ambulatory Visit: Payer: Self-pay

## 2022-07-29 DIAGNOSIS — G4452 New daily persistent headache (NDPH): Secondary | ICD-10-CM

## 2022-07-29 LAB — COMPREHENSIVE METABOLIC PANEL
ALT: 15 IU/L (ref 0–44)
AST: 14 IU/L (ref 0–40)
Albumin/Globulin Ratio: 2.2 (ref 1.2–2.2)
Albumin: 4.7 g/dL (ref 3.8–4.9)
Alkaline Phosphatase: 70 IU/L (ref 44–121)
BUN/Creatinine Ratio: 12 (ref 10–24)
BUN: 10 mg/dL (ref 8–27)
Bilirubin Total: 0.3 mg/dL (ref 0.0–1.2)
CO2: 24 mmol/L (ref 20–29)
Calcium: 9.5 mg/dL (ref 8.6–10.2)
Chloride: 104 mmol/L (ref 96–106)
Creatinine, Ser: 0.86 mg/dL (ref 0.76–1.27)
Globulin, Total: 2.1 g/dL (ref 1.5–4.5)
Glucose: 114 mg/dL — ABNORMAL HIGH (ref 70–99)
Potassium: 4.4 mmol/L (ref 3.5–5.2)
Sodium: 139 mmol/L (ref 134–144)
Total Protein: 6.8 g/dL (ref 6.0–8.5)
eGFR: 99 mL/min/{1.73_m2} (ref >59)

## 2022-07-29 LAB — CBC WITH DIFFERENTIAL/PLATELET
Basophils Absolute: 0.1 10*3/uL (ref 0.0–0.2)
Basos: 1 %
EOS (ABSOLUTE): 0.1 10*3/uL (ref 0.0–0.4)
Eos: 1 %
Hematocrit: 44.9 % (ref 37.5–51.0)
Hemoglobin: 15.5 g/dL (ref 13.0–17.7)
Immature Grans (Abs): 0 10*3/uL (ref 0.0–0.1)
Immature Granulocytes: 0 %
Lymphocytes Absolute: 1.5 10*3/uL (ref 0.7–3.1)
Lymphs: 22 %
MCH: 32.3 pg (ref 26.6–33.0)
MCHC: 34.5 g/dL (ref 31.5–35.7)
MCV: 94 fL (ref 79–97)
Monocytes Absolute: 0.5 10*3/uL (ref 0.1–0.9)
Monocytes: 7 %
Neutrophils Absolute: 4.5 10*3/uL (ref 1.4–7.0)
Neutrophils: 69 %
Platelets: 181 10*3/uL (ref 150–450)
RBC: 4.8 x10E6/uL (ref 4.14–5.80)
RDW: 12.8 % (ref 11.6–15.4)
WBC: 6.6 10*3/uL (ref 3.4–10.8)

## 2022-07-29 LAB — TSH: TSH: 1.03 u[IU]/mL (ref 0.450–4.500)

## 2022-07-29 LAB — SEDIMENTATION RATE: Sed Rate: 2 mm/hr (ref 0–30)

## 2022-08-08 ENCOUNTER — Encounter (HOSPITAL_COMMUNITY): Payer: Self-pay

## 2022-08-08 ENCOUNTER — Emergency Department (HOSPITAL_COMMUNITY)
Admission: EM | Admit: 2022-08-08 | Discharge: 2022-08-09 | Disposition: A | Discharge status: Home / Self Care | Payer: Medicare Other | Attending: Emergency Medicine | Admitting: Emergency Medicine

## 2022-08-08 ENCOUNTER — Emergency Department (HOSPITAL_COMMUNITY): Payer: Medicare Other

## 2022-08-08 ENCOUNTER — Other Ambulatory Visit: Payer: Self-pay

## 2022-08-08 DIAGNOSIS — H53141 Visual discomfort, right eye: Secondary | ICD-10-CM | POA: Insufficient documentation

## 2022-08-08 DIAGNOSIS — I10 Essential (primary) hypertension: Secondary | ICD-10-CM | POA: Diagnosis not present

## 2022-08-08 DIAGNOSIS — R519 Headache, unspecified: Secondary | ICD-10-CM | POA: Diagnosis not present

## 2022-08-08 DIAGNOSIS — G43011 Migraine without aura, intractable, with status migrainosus: Secondary | ICD-10-CM

## 2022-08-08 DIAGNOSIS — I2510 Atherosclerotic heart disease of native coronary artery without angina pectoris: Secondary | ICD-10-CM | POA: Insufficient documentation

## 2022-08-08 DIAGNOSIS — Z79899 Other long term (current) drug therapy: Secondary | ICD-10-CM | POA: Insufficient documentation

## 2022-08-08 DIAGNOSIS — J0100 Acute maxillary sinusitis, unspecified: Secondary | ICD-10-CM | POA: Diagnosis not present

## 2022-08-08 DIAGNOSIS — G44221 Chronic tension-type headache, intractable: Secondary | ICD-10-CM | POA: Diagnosis not present

## 2022-08-08 DIAGNOSIS — H538 Other visual disturbances: Secondary | ICD-10-CM | POA: Diagnosis not present

## 2022-08-08 DIAGNOSIS — Z7982 Long term (current) use of aspirin: Secondary | ICD-10-CM | POA: Insufficient documentation

## 2022-08-08 LAB — COMPREHENSIVE METABOLIC PANEL
ALT: 27 U/L (ref 0–44)
AST: 16 U/L (ref 15–41)
Albumin: 3.8 g/dL (ref 3.5–5.0)
Alkaline Phosphatase: 48 U/L (ref 38–126)
Anion gap: 8 (ref 5–15)
BUN: 11 mg/dL (ref 6–20)
CO2: 25 mmol/L (ref 22–32)
Calcium: 9.1 mg/dL (ref 8.9–10.3)
Chloride: 109 mmol/L (ref 98–111)
Creatinine, Ser: 1.02 mg/dL (ref 0.61–1.24)
GFR, Estimated: >60 mL/min (ref >60)
Glucose, Bld: 105 mg/dL — ABNORMAL HIGH (ref 70–99)
Potassium: 3.9 mmol/L (ref 3.5–5.1)
Sodium: 142 mmol/L (ref 135–145)
Total Bilirubin: 0.5 mg/dL (ref 0.3–1.2)
Total Protein: 6.4 g/dL — ABNORMAL LOW (ref 6.5–8.1)

## 2022-08-08 LAB — CBC WITH DIFFERENTIAL/PLATELET
Abs Immature Granulocytes: 0.04 10*3/uL (ref 0.00–0.07)
Basophils Absolute: 0.1 10*3/uL (ref 0.0–0.1)
Basophils Relative: 1 %
Eosinophils Absolute: 0.1 10*3/uL (ref 0.0–0.5)
Eosinophils Relative: 1 %
HCT: 46.2 % (ref 39.0–52.0)
Hemoglobin: 15.8 g/dL (ref 13.0–17.0)
Immature Granulocytes: 0 %
Lymphocytes Relative: 24 %
Lymphs Abs: 2.5 10*3/uL (ref 0.7–4.0)
MCH: 32.8 pg (ref 26.0–34.0)
MCHC: 34.2 g/dL (ref 30.0–36.0)
MCV: 96 fL (ref 80.0–100.0)
Monocytes Absolute: 0.7 10*3/uL (ref 0.1–1.0)
Monocytes Relative: 7 %
Neutro Abs: 7 10*3/uL (ref 1.7–7.7)
Neutrophils Relative %: 67 %
Platelets: 228 10*3/uL (ref 150–400)
RBC: 4.81 MIL/uL (ref 4.22–5.81)
RDW: 13.6 % (ref 11.5–15.5)
WBC: 10.4 10*3/uL (ref 4.0–10.5)
nRBC: 0 % (ref 0.0–0.2)

## 2022-08-08 LAB — C-REACTIVE PROTEIN: CRP: 0.6 mg/dL (ref <1.0)

## 2022-08-08 LAB — SEDIMENTATION RATE: Sed Rate: 3 mm/hr (ref 0–16)

## 2022-08-08 MED ORDER — GADOBUTROL 1 MMOL/ML IV SOLN
7.0000 mL | Freq: Once | INTRAVENOUS | Status: AC | PRN
  Administered 2022-08-08: 7 mL via INTRAVENOUS

## 2022-08-08 MED ORDER — SODIUM CHLORIDE 0.9 % IV BOLUS
1000.0000 mL | Freq: Once | INTRAVENOUS | Status: AC
  Administered 2022-08-08: 1000 mL via INTRAVENOUS

## 2022-08-08 MED ORDER — DIPHENHYDRAMINE HCL 50 MG/ML IJ SOLN
12.5000 mg | Freq: Once | INTRAMUSCULAR | Status: AC
Start: 2022-08-08 — End: 2022-08-08
  Administered 2022-08-08: 12.5 mg via INTRAVENOUS
  Filled 2022-08-08: qty 1

## 2022-08-08 MED ORDER — METOCLOPRAMIDE HCL 5 MG/ML IJ SOLN
5.0000 mg | Freq: Once | INTRAMUSCULAR | Status: AC
  Administered 2022-08-08: 5 mg via INTRAVENOUS
  Filled 2022-08-08: qty 2

## 2022-08-08 MED ORDER — IOHEXOL 350 MG/ML SOLN
100.0000 mL | Freq: Once | INTRAVENOUS | Status: AC | PRN
  Administered 2022-08-08: 100 mL via INTRAVENOUS

## 2022-08-08 NOTE — ED Notes (Signed)
Ptreports Rieye blurred vision which is new from a constant HA for the past month. Denies N/V/D

## 2022-08-08 NOTE — ED Notes (Signed)
Pt transported to MRI at this time 

## 2022-08-08 NOTE — ED Notes (Signed)
Transported to ct at this time ?

## 2022-08-08 NOTE — Consult Note (Signed)
NEURO HOSPITALIST CONSULT NOTE   Requestig physician: Dr. Sherry Ruffing  Reason for Consult: 4 week history of headache followed by 3 days of worsening blurry vision OD  History obtained from:  Patient and Chart     HPI:                                                                                                                                          Kurt Wagner is a 61 y.o. male with a PMHx of ectopic atrial rhythm, HTN, HLD, intervertebral disc disorder with myelopathy of lumbar region s/p lumbar disc surgery x 3 and CAD s/p stenting who presented to the ED early this afternoon for evaluation of a 4 week history of headache followed by 3 days of worsening blurry vision OD. He was evaluated by his PCP on 8/30 and diagnosed with a neuralgia type headache. ESR, TSH and CMP were obtained at that time. CT head was normal. He was prescribed a 9-day prednisone taper (20 TID x 3d >> 20 BID x 3 d >> 20 mg QD x 3 d). He notes that his headache has not improved after completing the full course of steroids.   The patient states that his headache is centered behind his right eye with sharp stabbing 8/10 pain and radiates as a less sharp but still severe throbbing pain to his right forehead, right frontal scalp, right temporal region and right cheekbone region, sparing his occiput, his neck and the left side of his head. Light makes the headache worse. No sound or smell sensitivity. The pain has been ongoing for the past 4 weeks with a fluctuating course, at times being as low as 2/10. Headache tends to worsen as the day progresses, but is always present and has not gone away with sleep. The worst it has been is 8/10. Analgesics and Norco have taken the edge off, but the pain returns as the medication wears off.   He has a prior history of cluster headaches with right eye injection and tearing that were felt to have been triggered by fentanyl patch treatment of back pain. The cluster  headaches resolved after fentanyl was discontinued.   Home medications include ASA, Plavix and atorvastatin. Also taking hydrocodone-acetaminophen.    Past Medical History:  Diagnosis Date   Ectopic atrial rhythm 12/26/2019   Essential (primary) hypertension    Essential hypertension 12/26/2019   Gastro-esophageal reflux disease with esophagitis    Intervertebral disc disorders with myelopathy of lumbar region    Lipoma of back 06/24/2016   Mixed hyperlipidemia    Nicotine dependence unspecified, with withdrawal    Precordial pain 12/26/2019   Tobacco use 12/26/2019    Past Surgical History:  Procedure Laterality Date   APPENDECTOMY     CHEST SURGERY  Tumor inside chest   CORONARY STENT PLACEMENT  05/12/2022   Xience Skypoint 3.0 mm x 33 mmCSN: [id number] [id number] REF: [id number], LOT [id number]   HERNIA REPAIR     LUMBAR DISC SURGERY     X3   TUMOR EXCISION     From back    Family History  Problem Relation Age of Onset   Hypertension Mother    Arthritis Mother    Atrial fibrillation Father              Social History:  reports that he quit smoking about 2 months ago. His smoking use included cigarettes. He has a 49.00 pack-year smoking history. He has quit using smokeless tobacco. He reports current alcohol use of about 12.0 standard drinks of alcohol per week. He reports that he does not use drugs.  Allergies  Allergen Reactions   Azithromycin Other (See Comments)    unknown   Fentanyl Other (See Comments)    Headache   Simvastatin Other (See Comments)    Joint pain   Tape Other (See Comments)    Blisters   Varenicline Other (See Comments)    unknown    MEDICATIONS:                                                                                                                     No current facility-administered medications on file prior to encounter.   Current Outpatient Medications on File Prior to Encounter  Medication Sig Dispense Refill    aspirin EC 81 MG tablet Take 1 tablet by mouth daily.     atorvastatin (LIPITOR) 80 MG tablet Take 80 mg by mouth daily.     clopidogrel (PLAVIX) 75 MG tablet Take by mouth.     ezetimibe (ZETIA) 10 MG tablet Take 1 tablet (10 mg total) by mouth daily. 90 tablet 0   HYDROcodone-acetaminophen (NORCO) 10-325 MG tablet Take by mouth.     lisinopril (ZESTRIL) 20 MG tablet TAKE 1/2 TABLET (10 MG) BY MOUTH ONCE DAILY. 90 tablet 2   nitroGLYCERIN (NITROSTAT) 0.4 MG SL tablet Place 1 tablet (0.4 mg total) under the tongue every 5 (five) minutes as needed for chest pain. 25 tablet 6   pantoprazole (PROTONIX) 40 MG tablet Take 1 tablet by mouth daily.       ROS:  Denies N/V/D. No focal weakness, sensory loss or ataxia. No aphasia or confusion. Other ROS as per HPI.   Blood pressure 102/72, pulse (!) 54, temperature 98.4 F (36.9 C), temperature source Oral, resp. rate 16, SpO2 99 %.   General Examination:                                                                                                      Physical Exam  HEENT-  [place]/AT. Palpation of right temple without swelling or palpable cord; temporal massage on the right improves his pain. Light touch of his right frontal and temporal scalp produces an irritating sensation. Palpation of his neck and left side of his head without effect. No scleral injection.  Lungs- Respirations unlabored Extremities- No edema   Neurological Examination Mental Status: Alert, oriented x 5, thought content appropriate.  Speech fluent without evidence of aphasia.  Able to follow all commands without difficulty. Cranial Nerves: II: Visual fields intact in all 4 quadrants of each eye tested individually. No extinction to DSS. Right pupil 2 mm and reactive, left pupil 3 mm and reactive. Visual acuity is 20/200 bilaterally with  spectacles on in dim light. Penlight to right eye elicits discomfort.  III,IV, VI: Right ptosis is noted. EOMI. No nystagmus.  V: Temp sensation equal bilaterally in V1 and V2 distributions. Higinio Plan obscures V3 distribution.   VII: Smile symmetric VIII: Hearing intact to voice IX,X: No hoarseness XI: Symmetric shoulder shrug XII: Midline tongue extension Motor: BUE 5/5 proximally and distally BLE 5/5 proximally and distally  No pronator drift.  Sensory: Temp and light touch intact throughout, bilaterally. No extinction to DSS.  Deep Tendon Reflexes: 2+ and symmetric throughout Cerebellar: No ataxia with FNF bilaterally  Gait: Deferred   Lab Results: Basic Metabolic Panel: Recent Labs  Lab 08/08/22 1335  NA 142  K 3.9  CL 109  CO2 25  GLUCOSE 105*  BUN 11  CREATININE 1.02  CALCIUM 9.1    CBC: Recent Labs  Lab 08/08/22 1335  WBC 10.4  NEUTROABS 7.0  HGB 15.8  HCT 46.2  MCV 96.0  PLT 228    Cardiac Enzymes: No results for input(s): "CKTOTAL", "CKMB", "CKMBINDEX", "TROPONINI" in the last 168 hours.  Lipid Panel: No results for input(s): "CHOL", "TRIG", "HDL", "CHOLHDL", "VLDL", "LDLCALC" in the last 168 hours.  Imaging: No results found.   Assessment: 61 y.o. male with a PMHx of ectopic atrial rhythm, HTN, HLD, intervertebral disc disorder with myelopathy of lumbar region s/p lumbar disc surgery x 3 and CAD s/p stenting who presented to the ED early this afternoon for evaluation of a 4 week history of headache followed by 3 days of worsening blurry vision OD. He was evaluated by his PCP on 8/30 and diagnosed with a neuralgia type headache. ESR, TSH and CMP were obtained at that time. CT head was normal. He was prescribed a 9-day prednisone taper (20 TID x 3d >> 20 BID x 3 d >> 20 mg QD x 3 d). He notes that his headache has not improved after completing the full  course of steroids. Home medications include ASA, Plavix and atorvastatin. Also taking  hydrocodone-acetaminophen.  - Imaging:  - CTA of head and neck with CTV of head:  No intracranial large vessel occlusion or significant stenosis. No hemodynamically significant stenosis in the neck. No acute intracranial process. No evidence of venous sinus thrombosis or stenosis. Air-fluid level with bubbly fluid in the right maxillary sinus, as can be seen with acute sinusitis. Aortic Atherosclerosis (ICD10-I70.0) and Emphysema (ICD10-J43.9). Degenerative changes in the cervical spine with at least moderate spinal canal stenosis at C5-C6.  - MRI of brain and orbits with and without contrast: No acute intracranial process. No evidence of acute or subacute infarct. No abnormal parenchymal or meningeal enhancement. Normal MRI of the orbits. Mucosal thickening and air-fluid level with bubbly fluid in the right maxillary sinus, as can be seen with acute sinusitis. - Exam reveals right ptosis, mild right miiosis, no scleral injection, no palpable cord or tenderness to right temple, symmetrically decreased visual acuity with spectacles suggestive of insufficient refractory power of his prescription lenses, no scotoma, no nystagmus and no visual field cut. Headache is 8/10 initially during exam, which went down to 4/10 in the context of extensive education, viewing of his MRI studies with wife and concomitant magnesium infusion.  - Overall presentation is most consistent with status migrainosus, possibly with an analgesic rebound component. Sinus infection as seen on MRI brain may also be contributing.    Recommendations: - Addition of 25 mg IV phenergan x 1 to his regimen - Educated regarding avoiding meds that can predispose to analgesic rebound headache including Advil, Tylenol, opiates, Excedrin migraine and Goody's powders.  - Monitor for further improvement in his headache pain.  - Consider ABX treatment of right maxillary sinusitis.  - Consider starting Topamax at 25 mg po qd for migraine prevention -  Outpatient Neurology follow up.    Electronically signed: Dr. Kerney Elbe 08/08/2022, 7:19 PM

## 2022-08-08 NOTE — ED Provider Notes (Signed)
  Provider Note MRN:  [id number]  Arrival date & time: 08/08/22    ED Course and Medical Decision Making  Assumed care from Tegeler at shift change.  See note from prior team for complete details, in brief:   61 year old male history hypertension hyperlipidemia, CAD, GERD to the ED with headache, vision changes.  Over the past 4 weeks, right-sided temporal.  Progressive worsening vision to his right eye the past few days.  Neurologic exam is nonfocal He is on aspirin/Plavix MRI orbit, MRI brain w/wo was negative. Also reviewed CTV and CTA head/neck which were unremarkable.  CTA head and neck and CT venogram was also negative ESR/CRP wnl Prior team spoke with Dr Curly Shores who was able to help guide imaging, recommends d/w neuro after imaging has resulted to determine dispo  Plan per prior physician f/u MRI, d/w neuro once resulted, likely will require admission   Spoke with Dr Cheral Marker, he will see at the bedside; recommend migraine cocktail and re-assess. Pt was given reglan/benadryl/IVF. If not improved after around 1-1.5 hours Dr Cheral Marker recommends phenergan 22m IVPB.   Signed out to incoming EDP pending neuro eval.    Procedures  Final Clinical Impressions(s) / ED Diagnoses     ICD-10-CM   1. Temporal pain  R51.9     2. Blurred vision, right eye  H53.8     3. Bad headache  R51.9       ED Discharge Orders     None       Discharge Instructions   None        GJeanell Sparrow DO 08/08/22 2331

## 2022-08-08 NOTE — ED Notes (Signed)
Pt provided something to eat and drink per ED provider it was ok. Pt also provided medication as ordered for headache at this time

## 2022-08-08 NOTE — ED Provider Notes (Signed)
11:39 PM Assumed care from Drs. Pearline Cables and Tegeler, please see their note for full history, physical and decision making until this point. In brief this is a 61 y.o. year old male who presented to the ED tonight with Migraine     Workup complete and overall reassuring. Getting HA meds. Pending Neuro consult.  Informed by nursing that patient was very upset that noone had communicated any results, plans, dispositions with him. Headache is somewhat improved but not great. I explained all his test results and what we were waiting on. Offered more headache medicines which he was grateful for. Called Dr. Cheral Marker to see him ASAP.   Reeval and Dr. Cheral Marker thinks this is a migraine. HA is improved but not gone. Recommends a dose of phenergan. I will also start antibiotics for the sinusitis seen on imaging. He has a neurologist but is unsure if they will be able to see him for headaches, if not a referral was placed for [facility]. Patient ultimately satisfied with situation and care. Discharged in stable condition.   Discharge instructions, including strict return precautions for new or worsening symptoms, given. Patient and/or family verbalized understanding and agreement with the plan as described.   Labs, studies and imaging reviewed by myself and considered in medical decision making if ordered. Imaging interpreted by radiology.  Labs Reviewed  COMPREHENSIVE METABOLIC PANEL - Abnormal; Notable for the following components:      Result Value   Glucose, Bld 105 (*)    Total Protein 6.4 (*)    All other components within normal limits  CBC WITH DIFFERENTIAL/PLATELET  SEDIMENTATION RATE  C-REACTIVE PROTEIN    MR Brain W and Wo Contrast  Final Result    MR ORBITS W WO CONTRAST  Final Result    CT VENOGRAM HEAD  Final Result    CT ANGIO HEAD NECK W WO CM  Final Result      No follow-ups on file.    Kimari Coudriet, Corene Cornea, MD 08/09/22 [phone]

## 2022-08-08 NOTE — ED Notes (Signed)
Received verbal report from Lorren B RN at this time 

## 2022-08-08 NOTE — ED Notes (Signed)
Provider made aware of the pupils being two different sizes

## 2022-08-08 NOTE — ED Provider Notes (Signed)
[facility] EMERGENC[facility] DEPARTMENT Provider Note   CSN: [id number] Arrival date & time: 08/08/22  1215     History  No chief complaint on file.   Kurt Wagner is a 61 y.o. male.  The history is provided by the patient, the spouse and medical records. No language interpreter was used.  Headache Pain location:  R temporal and frontal Quality:  Sharp and dull Severity currently:  6/10 Severity at highest:  10/10 Onset quality:  Gradual Duration:  4 weeks Timing:  Constant Progression:  Worsening Chronicity:  New Similar to prior headaches: no   Context: bright light   Relieved by:  Nothing Worsened by:  Nothing Ineffective treatments:  None tried Associated symptoms: blurred vision, nausea, photophobia and visual change   Associated symptoms: no abdominal pain, no congestion, no cough, no dizziness, no eye pain, no facial pain, no fatigue, no fever, no focal weakness, no numbness, no paresthesias, no seizures, no sinus pressure and no vomiting        Home Medications Prior to Admission medications   Medication Sig Start Date End Date Taking? Authorizing Provider  aspirin EC 81 MG tablet Take 1 tablet by mouth daily. 05/15/22 05/15/23  [provider]  atorvastatin (LIPITOR) 80 MG tablet Take 80 mg by mouth daily. 05/14/22   [provider]  clopidogrel (PLAVIX) 75 MG tablet Take by mouth. 05/31/22 05/31/23  [provider]  ezetimibe (ZETIA) 10 MG tablet Take 1 tablet (10 mg total) by mouth daily. 05/24/22 08/22/22  Rip Harbour, NP  HYDROcodone-acetaminophen (NORCO) 10-325 MG tablet Take by mouth.    [provider]  lisinopril (ZESTRIL) 20 MG tablet TAKE 1/2 TABLET (10 MG) BY MOUTH ONCE DAILY. 05/15/22   Lillard Anes, MD  nitroGLYCERIN (NITROSTAT) 0.4 MG SL tablet Place 1 tablet (0.4 mg total) under the tongue every 5 (five) minutes as needed for chest pain. 01/07/21   Lillard Anes, MD  pantoprazole  (PROTONIX) 40 MG tablet Take 1 tablet by mouth daily. 05/15/22 05/15/23  [provider]      Allergies    Azithromycin, Fentanyl, Simvastatin, Tape, and Varenicline    Review of Systems   Review of Systems  Constitutional:  Negative for chills, fatigue and fever.  HENT:  Negative for congestion and sinus pressure.   Eyes:  Positive for blurred vision and photophobia. Negative for pain.  Respiratory:  Negative for cough, chest tightness, shortness of breath and wheezing.   Cardiovascular:  Negative for chest pain.  Gastrointestinal:  Positive for nausea. Negative for abdominal pain and vomiting.  Neurological:  Positive for headaches. Negative for dizziness, focal weakness, seizures, numbness and paresthesias.  All other systems reviewed and are negative.   Physical Exam Updated Vital Signs There were no vitals taken for this visit. Physical Exam Vitals and nursing note reviewed.  Constitutional:      General: He is not in acute distress.    Appearance: He is well-developed. He is not ill-appearing, toxic-appearing or diaphoretic.  HENT:     Head: Atraumatic.      Comments: Tenderness to the right temporal area.  No erythema or skin changes seen.    Nose: No congestion or rhinorrhea.     Mouth/Throat:     Mouth: Mucous membranes are moist.     Pharynx: No oropharyngeal exudate or posterior oropharyngeal erythema.  Eyes:     General: No scleral icterus.    Extraocular Movements: Extraocular movements intact.  Right eye: Normal extraocular motion.     Left eye: Normal extraocular motion.     Conjunctiva/sclera: Conjunctivae normal.     Right eye: Right conjunctiva is not injected.     Left eye: Left conjunctiva is not injected.     Comments: Uneven pupils.  Left side is larger than right but both are reactive.  Right is 2 mm and left is 4 mm.  Cardiovascular:     Rate and Rhythm: Normal rate and regular rhythm.     Heart sounds: No murmur heard. Pulmonary:      Effort: Pulmonary effort is normal. No respiratory distress.     Breath sounds: Normal breath sounds. No wheezing, rhonchi or rales.  Chest:     Chest wall: No tenderness.  Abdominal:     Palpations: Abdomen is soft.     Tenderness: There is no abdominal tenderness. There is no right CVA tenderness, left CVA tenderness, guarding or rebound.  Musculoskeletal:        General: Tenderness present. No swelling.     Cervical back: Neck supple.     Right lower leg: No edema.     Left lower leg: No edema.  Skin:    General: Skin is warm and dry.     Capillary Refill: Capillary refill takes less than 2 seconds.     Findings: No erythema or rash.  Neurological:     Mental Status: He is alert.     Sensory: No sensory deficit.     Motor: No weakness.  Psychiatric:        Mood and Affect: Mood normal.     ED Results / Procedures / Treatments   Labs (all labs ordered are listed, but only abnormal results are displayed) Labs Reviewed  COMPREHENSIVE METABOLIC PANEL - Abnormal; Notable for the following components:      Result Value   Glucose, Bld 105 (*)    Total Protein 6.4 (*)    All other components within normal limits  CBC WITH DIFFERENTIAL/PLATELET  SEDIMENTATION RATE  C-REACTIVE PROTEIN    EKG None  Radiology CT VENOGRAM HEAD  Result Date: 08/08/2022 CLINICAL DATA:  Right temporal headache going to top of head with worsening blurry vision. EXAM: CT ANGIOGRAPHY HEAD AND NECK CT VENOGRAM TECHNIQUE: Multidetector CT imaging of the head and neck was performed using the standard protocol during bolus administration of intravenous contrast. Multiplanar CT image reconstructions and MIPs were obtained to evaluate the vascular anatomy. Carotid stenosis measurements (when applicable) are obtained utilizing NASCET criteria, using the distal internal carotid diameter as the denominator. Venographic phase images of the brain were obtained following the administration of intravenous contrast.  Multiplanar reformats and maximum intensity projections were generated. RADIATION DOSE REDUCTION: This exam was performed according to the departmental dose-optimization program which includes automated exposure control, adjustment of the mA and/or kV according to patient size and/or use of iterative reconstruction technique. CONTRAST:  135m OMNIPAQUE IOHEXOL 350 MG/ML SOLN COMPARISON:  No prior CTA or CTV, correlation is made with CT head 07/28/2022 FINDINGS: CT HEAD FINDINGS Brain: No evidence of acute infarct, hemorrhage, mass, mass effect, or midline shift. No hydrocephalus or extra-axial fluid collection. Vascular: No hyperdense vessel. Skull: Normal. Negative for fracture or focal lesion. Sinuses/Orbits: Air-fluid level with bubbly fluid in the right maxillary sinus. Other: The mastoid air cells are well aerated. CTA NECK FINDINGS Aortic arch: Standard branching. Imaged portion shows no evidence of aneurysm or dissection. No significant stenosis of the major arch vessel  origins. Mild aortic atherosclerotic disease. Right carotid system: No evidence of dissection, occlusion, or hemodynamically significant stenosis (greater than 50%). Left carotid system: No evidence of dissection, occlusion, or hemodynamically significant stenosis (greater than 50%). Vertebral arteries: No evidence of dissection, occlusion, or hemodynamically significant stenosis (greater than 50%). Skeleton: Degenerative changes in the cervical spine with reversal of the normal cervical lordosis and at least moderate spinal canal stenosis at C5-C6. Other neck: Small hypoenhancing nodules in the thyroid (< 1.5 cm), for which no follow-up is indicated. (Reference: J Am Coll Radiol. 2015 Feb;12(2): 143-50) Upper chest: Apical pleural-parenchymal scarring. Emphysema. No focal pulmonary opacity or pleural effusion. Review of the MIP images confirms the above findings CTA HEAD FINDINGS Anterior circulation: Both internal carotid arteries are patent  to the termini, without significant stenosis. A1 segments patent. Normal anterior communicating artery. Anterior cerebral arteries are patent to their distal aspects. No M1 stenosis or occlusion. MCA branches perfused and symmetric. Posterior circulation: Vertebral arteries patent to the vertebrobasilar junction without stenosis. Posterior inferior cerebellar arteries patent proximally. Basilar patent to its distal aspect. Superior cerebellar arteries patent proximally. Patent P1 segments. PCAs are mildly irregular but perfused to their distal aspects without stenosis. The bilateral posterior communicating arteries are not visualized. Anatomic variants: None significant. Review of the MIP images confirms the above findings CTV HEAD FINDINGS Superior sagittal sinus: Normal. Straight sinus: Normal. Inferior sagittal sinus, vein of Galen and internal cerebral veins: Normal. Transverse sinuses: Normal. Sigmoid sinuses: Normal. Visualized jugular veins: Normal IMPRESSION: 1.  No intracranial large vessel occlusion or significant stenosis. 2.  No hemodynamically significant stenosis in the neck. 3.  No acute intracranial process. 4. No evidence of venous sinus thrombosis or stenosis. 5. Air-fluid level with bubbly fluid in the right maxillary sinus, as can be seen with acute sinusitis. Correlate with symptoms. 6. Aortic Atherosclerosis (ICD10-I70.0) and Emphysema (ICD10-J43.9). 7. Degenerative changes in the cervical spine with at least moderate spinal canal stenosis at C5-C6. If clinically indicated, this could be further evaluated with a nonemergent MRI of the cervical spine. Electronically Signed   By: Merilyn Baba M.D.   On: 08/08/2022 20:18   CT ANGIO HEAD NECK W WO CM  Result Date: 08/08/2022 CLINICAL DATA:  Right temporal headache going to top of head with worsening blurry vision. EXAM: CT ANGIOGRAPHY HEAD AND NECK CT VENOGRAM TECHNIQUE: Multidetector CT imaging of the head and neck was performed using the  standard protocol during bolus administration of intravenous contrast. Multiplanar CT image reconstructions and MIPs were obtained to evaluate the vascular anatomy. Carotid stenosis measurements (when applicable) are obtained utilizing NASCET criteria, using the distal internal carotid diameter as the denominator. Venographic phase images of the brain were obtained following the administration of intravenous contrast. Multiplanar reformats and maximum intensity projections were generated. RADIATION DOSE REDUCTION: This exam was performed according to the departmental dose-optimization program which includes automated exposure control, adjustment of the mA and/or kV according to patient size and/or use of iterative reconstruction technique. CONTRAST:  127m OMNIPAQUE IOHEXOL 350 MG/ML SOLN COMPARISON:  No prior CTA or CTV, correlation is made with CT head 07/28/2022 FINDINGS: CT HEAD FINDINGS Brain: No evidence of acute infarct, hemorrhage, mass, mass effect, or midline shift. No hydrocephalus or extra-axial fluid collection. Vascular: No hyperdense vessel. Skull: Normal. Negative for fracture or focal lesion. Sinuses/Orbits: Air-fluid level with bubbly fluid in the right maxillary sinus. Other: The mastoid air cells are well aerated. CTA NECK FINDINGS Aortic arch: Standard branching. Imaged portion shows no  evidence of aneurysm or dissection. No significant stenosis of the major arch vessel origins. Mild aortic atherosclerotic disease. Right carotid system: No evidence of dissection, occlusion, or hemodynamically significant stenosis (greater than 50%). Left carotid system: No evidence of dissection, occlusion, or hemodynamically significant stenosis (greater than 50%). Vertebral arteries: No evidence of dissection, occlusion, or hemodynamically significant stenosis (greater than 50%). Skeleton: Degenerative changes in the cervical spine with reversal of the normal cervical lordosis and at least moderate spinal  canal stenosis at C5-C6. Other neck: Small hypoenhancing nodules in the thyroid (< 1.5 cm), for which no follow-up is indicated. (Reference: J Am Coll Radiol. 2015 Feb;12(2): 143-50) Upper chest: Apical pleural-parenchymal scarring. Emphysema. No focal pulmonary opacity or pleural effusion. Review of the MIP images confirms the above findings CTA HEAD FINDINGS Anterior circulation: Both internal carotid arteries are patent to the termini, without significant stenosis. A1 segments patent. Normal anterior communicating artery. Anterior cerebral arteries are patent to their distal aspects. No M1 stenosis or occlusion. MCA branches perfused and symmetric. Posterior circulation: Vertebral arteries patent to the vertebrobasilar junction without stenosis. Posterior inferior cerebellar arteries patent proximally. Basilar patent to its distal aspect. Superior cerebellar arteries patent proximally. Patent P1 segments. PCAs are mildly irregular but perfused to their distal aspects without stenosis. The bilateral posterior communicating arteries are not visualized. Anatomic variants: None significant. Review of the MIP images confirms the above findings CTV HEAD FINDINGS Superior sagittal sinus: Normal. Straight sinus: Normal. Inferior sagittal sinus, vein of Galen and internal cerebral veins: Normal. Transverse sinuses: Normal. Sigmoid sinuses: Normal. Visualized jugular veins: Normal IMPRESSION: 1.  No intracranial large vessel occlusion or significant stenosis. 2.  No hemodynamically significant stenosis in the neck. 3.  No acute intracranial process. 4. No evidence of venous sinus thrombosis or stenosis. 5. Air-fluid level with bubbly fluid in the right maxillary sinus, as can be seen with acute sinusitis. Correlate with symptoms. 6. Aortic Atherosclerosis (ICD10-I70.0) and Emphysema (ICD10-J43.9). 7. Degenerative changes in the cervical spine with at least moderate spinal canal stenosis at C5-C6. If clinically indicated,  this could be further evaluated with a nonemergent MRI of the cervical spine. Electronically Signed   By: Merilyn Baba M.D.   On: 08/08/2022 20:18    Procedures Procedures    Medications Ordered in ED Medications  iohexol (OMNIPAQUE) 350 MG/ML injection 100 mL (100 mLs Intravenous Contrast Given 08/08/22 1938)  gadobutrol (GADAVIST) 1 MMOL/ML injection 7 mL (7 mLs Intravenous Contrast Given 08/08/22 2049)    ED Course/ Medical Decision Making/ A&P                           Medical Decision Making Amount and/or Complexity of Data Reviewed Radiology: ordered.  Risk Prescription drug management.   DREDEN RIVERE is a 61 y.o. male with a past medical history significant for hypertension, hyperlipidemia, CAD with previous PCI several months ago, and GERD who presents with headache and now vision changes.  According to patient, for the last 4 weeks he has had atypical headache primarily on his right temporal area.  He said it also goes to the top of his head but is never had headaches like this before.  No pain in his neck or occiput in his no recent trauma.  No recent neck injury.  Denies any vomiting but had some nausea.  He reports that over the last several days he has had progressive blurriness of his right eye.  No eye pain itself  but more behind into the side of the eye.  Denies any numbness, tingling, weakness of extremities.  No facial droop reported.  Normal sensation reported of the face.  No history of temporal arteritis.  He does report he is on aspirin and Plavix after having previous MI.  Denies any tick exposures, fevers, or chills.  Denies any other URI symptoms or cough.  Patient says that 2 weeks ago he was seen in the emergency department where he had a CT head that was normal and was given a short course of steroids to try to help with headache.  He reports that it helped for several days but then his symptoms began to return and worsen.  With the vision worsening and  headache worsening, he presents for evaluation.  He reports at its worst is a 10 out of 10 but is currently a 7 out of 10.  On my exam, patient does have tenderness in the temporal area of his head.  Top of his head was less tender.  Neck was nontender and normal range of motion.  No carotid bruit appreciated.  Symmetric smile.  Clear speech.  Both pupils are reactive to light and there is no erythema or redness or injected conjunctiva however the right pupil is smaller in size than the left.  Normal extraocular movements.  No tenderness when eyeball was pressed.  No sensation of foreign body present.  It is reactive.  No other focal neurologic deficits with intact sensation, strength, and pulses in extremities.  Normal finger-nose-finger testing.  Lungs clear and chest nontender.  Abdomen nontender.  Clinically I am somewhat concerned that patient could have temporal arteritis given the vision changes versus something like a dural venous sinus thrombus given his prolonged headache with these vision changes.  He had some screening labs in triage that had normal ESR and CRP however a not insignificant percentage of patients with temporal arteritis can have negative reactive markers.  We will discuss with neurology recommendations in regards to imaging and further work-up.   6:46 PM Just spoke with Dr. Curly Shores with neurology who help guide imaging.  She recommends getting MRI brain and orbits with and without contrast, CTA head and neck, and CTV of the head to rule out tumors, stroke, optic neuritis, dural venous sinus thrombus, aneurysm, bleed, or other abnormalities before treating for possible temporal arteritis.  She request we consult neurology after images are completed to determine disposition and see if patient will need admission for high-dose steroids to prevent vision loss if this is temporal arteritis.  8:53 PM Care will be transferred to oncoming team to await results of MRI.  Plan of care is to  discuss with neurology after imaging is completed to determine if needs admission for high-dose steroids or other disposition.        Final Clinical Impression(s) / ED Diagnoses Final diagnoses:  Temporal pain  Blurred vision, right eye     Clinical Impression: 1. Temporal pain   2. Blurred vision, right eye     Disposition: Care transferred to oncoming team to await neurology recommendations after MRI is completed.  This note was prepared with assistance of Systems analyst. Occasional wrong-word or sound-a-like substitutions may have occurred due to the inherent limitations of voice recognition software.      Leonid Manus, Gwenyth Allegra, MD 08/08/22 2055

## 2022-08-08 NOTE — ED Provider Triage Note (Signed)
Emergency Medicine Provider Triage Evaluation Note  Kurt Wagner , a 61 y.o. male  was evaluated in triage.  Pt complains of headache and blurred vision. He states that his headache has been present for the past 4 weeks and has been persistent in nature.  States that 2 weeks ago he went to [facility] and had CT imaging of his head that was unremarkable.  He was discharged with steroids which he took with minimal relief in symptoms.  States that 1 week ago he began to develop blurred vision in his right eye only.  Endorses significant tenderness to palpation over the right side of his face.  Denies any fevers, chills, dizziness, diplopia, painful EOMs, or weakness.  No history of polymyalgia rheumatica.  Review of Systems  Positive:  Negative:   Physical Exam  There were no vitals taken for this visit. Gen:   Awake, no distress   Resp:  Normal effort  MSK:   Moves extremities without difficulty  Other:  Alert and oriented and neurologically intact with no focal deficits.  EOMs intact.  PERRLA.  Significant tenderness noted to palpation of the temporal artery.  Medical Decision Making  Medically screening exam initiated at 1:21 PM.  Appropriate orders placed.  Kurt Wagner was informed that the remainder of the evaluation will be completed by another provider, this initial triage assessment does not replace that evaluation, and the importance of remaining in the ED until their evaluation is complete.  Concern for temporal arteritis. ESR CRP ordered   Bud Face, PA-C 08/08/22 1324

## 2022-08-08 NOTE — ED Notes (Signed)
Pt and family calling out stating that the pain is worse

## 2022-08-08 NOTE — ED Notes (Signed)
Pt is requesting something to eat and drink. He is also c/o pain. Sent provider a message via epic reference same

## 2022-08-08 NOTE — ED Triage Notes (Signed)
Patient with ongoing headache following negative ct and finishing steroids. Patient alert and oriented and blurred vision with same. This makes 4 weeks

## 2022-08-09 DIAGNOSIS — J0100 Acute maxillary sinusitis, unspecified: Secondary | ICD-10-CM | POA: Insufficient documentation

## 2022-08-09 DIAGNOSIS — G43011 Migraine without aura, intractable, with status migrainosus: Secondary | ICD-10-CM | POA: Insufficient documentation

## 2022-08-09 DIAGNOSIS — R519 Headache, unspecified: Secondary | ICD-10-CM | POA: Insufficient documentation

## 2022-08-09 MED ORDER — AMOXICILLIN-POT CLAVULANATE 875-125 MG PO TABS: 1.0000 | ORAL_TABLET | Freq: Two times a day (BID) | ORAL | 0 refills | Status: DC

## 2022-08-09 MED ORDER — MAGNESIUM SULFATE 2 GM/50ML IV SOLN
2.0000 g | Freq: Once | INTRAVENOUS | Status: AC
  Administered 2022-08-09: 2 g via INTRAVENOUS
  Filled 2022-08-09: qty 50

## 2022-08-09 MED ORDER — SODIUM CHLORIDE 0.9 % IV BOLUS
1000.0000 mL | Freq: Once | INTRAVENOUS | Status: AC
  Administered 2022-08-09: 1000 mL via INTRAVENOUS

## 2022-08-09 MED ORDER — PREDNISONE 20 MG PO TABS: ORAL_TABLET | ORAL | 0 refills | Status: DC

## 2022-08-09 MED ORDER — SODIUM CHLORIDE 0.9 % IV SOLN
25.0000 mg | Freq: Four times a day (QID) | INTRAVENOUS | Status: DC | PRN
  Administered 2022-08-09: 25 mg via INTRAVENOUS
  Filled 2022-08-09: qty 1

## 2022-08-09 MED ORDER — TOPIRAMATE 25 MG PO TABS: 25.0000 mg | ORAL_TABLET | Freq: Two times a day (BID) | ORAL | 0 refills | Status: DC

## 2022-08-09 MED ORDER — METHYLPREDNISOLONE SODIUM SUCC 125 MG IJ SOLR
125.0000 mg | Freq: Once | INTRAMUSCULAR | Status: AC
Start: 2022-08-09 — End: 2022-08-09
  Administered 2022-08-09: 125 mg via INTRAVENOUS
  Filled 2022-08-09: qty 2

## 2022-08-09 MED ORDER — BUTALBITAL-APAP-CAFFEINE 50-325-40 MG PO TABS: 1.0000 | ORAL_TABLET | Freq: Four times a day (QID) | ORAL | 0 refills | Status: DC | PRN

## 2022-08-09 NOTE — ED Notes (Signed)
EVS advised me that this pt was asking to speak with the provider. Entered the room to ask pt how could help. Pt became very agitated. Pt started yelling and cussing at this RN about them not seeing a provider, they wanted test results, and that they have been here for over 13 hrs and that they were done with it. Advised pt and family that they were not going to yell or cuss at this nurse. That this nurse has provided them with everything they have asked for and that I have treated them with dignity and respect. At this time pt started yelling again advising they wanted the doctor now. Went to ED provider Dr. Dayna Barker at this time

## 2022-08-09 NOTE — ED Notes (Signed)
Neuro at bedside.

## 2022-08-09 NOTE — ED Notes (Signed)
ED provider at bedside.

## 2022-08-09 NOTE — ED Notes (Signed)
ED provider at bedside at this time 

## 2022-08-11 ENCOUNTER — Encounter: Payer: Self-pay | Admitting: Physician Assistant

## 2022-08-11 ENCOUNTER — Ambulatory Visit (INDEPENDENT_AMBULATORY_CARE_PROVIDER_SITE_OTHER): Payer: Medicare Other | Admitting: Physician Assistant

## 2022-08-11 ENCOUNTER — Telehealth: Payer: Self-pay

## 2022-08-11 VITALS — BP 118/74 | HR 70 | Temp 97.9°F | Ht 71.0 in | Wt 154.0 lb

## 2022-08-11 DIAGNOSIS — G4452 New daily persistent headache (NDPH): Secondary | ICD-10-CM | POA: Diagnosis not present

## 2022-08-11 DIAGNOSIS — M792 Neuralgia and neuritis, unspecified: Secondary | ICD-10-CM | POA: Diagnosis not present

## 2022-08-11 LAB — SEDIMENTATION RATE: Sed Rate: 3 mm/hr (ref 0–30)

## 2022-08-11 MED ORDER — PREDNISONE 20 MG PO TABS: 20.0000 mg | ORAL_TABLET | Freq: Three times a day (TID) | ORAL | 0 refills | Status: DC

## 2022-08-11 MED ORDER — TRAMADOL HCL 50 MG PO TABS: 50.0000 mg | ORAL_TABLET | Freq: Four times a day (QID) | ORAL | 0 refills | Status: AC | PRN

## 2022-08-11 NOTE — Telephone Encounter (Signed)
Patient wife called and stated patient headaches have got worst, She took him to [facility] Sunday due to having headaches, eye was droopy, and blurry vision. Please see Hospital visit in chart, appointment at 10:40.

## 2022-08-11 NOTE — Progress Notes (Signed)
Acute Office Visit  Subjective:    Patient ID: Kurt Wagner, male    DOB: 12-19-1960, 60 y.o.   MRN: [id number]  Chief Complaint  Patient presents with   ER follow up - headache    HPI: Patient is in today for follow up of temporal / occipital headache - pt was seen in office on 8/31 for new persistent daily headache - at that time he was not having any type of vision symptoms and pain was mild to moderate.  Head CT was done which was normal and sed rate was normal.  He was treated with prednisone taper which he states did improve symptoms however since completing prednisone his symptoms worsened and he went to ED on 08/08/22 --- Labwork was repeated which was normal and besides some air bubbles in right sinus he had a normal MRI of brain, MRI orbit, CTA head and neck , and CT venogram - He was released and given Augmentin 852m bid, Topamax 250mbid, and Fioricet prn.  None of the medication has helped his symptoms Of note he has not seen opthamologist in over 8 years Patient states now he feels he has a film over his right eye, his right eye is significantly light sensitive and has a sharp pain behind right eye along with tenderness over the right temple and top of his head He states here in office today is 2 out of 10 on pain scale  Past Medical History:  Diagnosis Date   Ectopic atrial rhythm 12/26/2019   Essential (primary) hypertension    Essential hypertension 12/26/2019   Gastro-esophageal reflux disease with esophagitis    Intervertebral disc disorders with myelopathy of lumbar region    Lipoma of back 06/24/2016   Mixed hyperlipidemia    Nicotine dependence unspecified, with withdrawal    Precordial pain 12/26/2019   Tobacco use 12/26/2019    Past Surgical History:  Procedure Laterality Date   APPENDECTOMY     CHEST SURGERY     Tumor inside chest   CORONARY STENT PLACEMENT  05/12/2022   Xience Skypoint 3.0 mm x 33 mmCSN: [id number]-[id number] REF:  [id number] HERNIA REPAIR     LUMBAR DISC SURGERY     X3   TUMOR EXCISION     From back    Family History  Problem Relation Age of Onset   Hypertension Mother    Arthritis Mother    Atrial fibrillation Father     Social History   Socioeconomic History   Marital status: Married    Spouse name: Not on file   Number of children: Not on file   Years of education: Not on file   Highest education level: Not on file  Occupational History   Not on file  Tobacco Use   Smoking status: Former    Packs/day: 1.00    Years: 49.00    Total pack years: 49.00    Types: Cigarettes    Quit date: 05/24/2022    Years since quitting: 0.2   Smokeless tobacco: Former  VaScientific laboratory technicianse: Never used  Substance and Sexual Activity   Alcohol use: Yes    Alcohol/week: 12.0 standard drinks of alcohol    Types: 12 Cans of beer per week   Drug use: Never   Sexual activity: Yes    Partners: Female  Other Topics Concern   Not on file  Social History Narrative   Not on file   Social Determinants  of Health   Financial Resource Strain: Not on file  Food Insecurity: Not on file  Transportation Needs: Not on file  Physical Activity: Not on file  Stress: Not on file  Social Connections: Not on file  Intimate Partner Violence: Not on file    Outpatient Medications Prior to Visit  Medication Sig Dispense Refill   amoxicillin-clavulanate (AUGMENTIN) 875-125 MG tablet Take 1 tablet by mouth every 12 (twelve) hours. 14 tablet 0   aspirin EC 81 MG tablet Take 1 tablet by mouth daily.     atorvastatin (LIPITOR) 80 MG tablet Take 80 mg by mouth daily.     clopidogrel (PLAVIX) 75 MG tablet Take by mouth.     ezetimibe (ZETIA) 10 MG tablet Take 1 tablet (10 mg total) by mouth daily. 90 tablet 0   lisinopril (ZESTRIL) 20 MG tablet TAKE 1/2 TABLET (10 MG) BY MOUTH ONCE DAILY. 90 tablet 2   nitroGLYCERIN (NITROSTAT) 0.4 MG SL tablet Place 1 tablet (0.4 mg total) under the tongue  every 5 (five) minutes as needed for chest pain. 25 tablet 6   pantoprazole (PROTONIX) 40 MG tablet Take 1 tablet by mouth daily.     topiramate (TOPAMAX) 25 MG tablet Take 1 tablet (25 mg total) by mouth 2 (two) times daily. 60 tablet 0   butalbital-acetaminophen-caffeine (FIORICET) 50-325-40 MG tablet Take 1-2 tablets by mouth every 6 (six) hours as needed for headache. 20 tablet 0   HYDROcodone-acetaminophen (NORCO) 10-325 MG tablet Take by mouth.     predniSONE (DELTASONE) 20 MG tablet 3 tabs po daily x 3 days, then 2 tabs x 3 days, then 1.5 tabs x 3 days, then 1 tab x 3 days, then 0.5 tabs x 3 days 27 tablet 0   No facility-administered medications prior to visit.    Allergies  Allergen Reactions   Azithromycin Other (See Comments)    unknown   Fentanyl Other (See Comments)    Headache   Simvastatin Other (See Comments)    Joint pain   Tape Other (See Comments)    Blisters   Varenicline Other (See Comments)    unknown    Review of Systems CONSTITUTIONAL: Negative for chills, fatigue, fever, unintentional weight gain and unintentional weight loss.  E/N/T: Negative for ear pain, nasal congestion and sore throat.  CARDIOVASCULAR: Negative for chest pain, dizziness, palpitations  RESPIRATORY: Negative for recent cough and dyspnea.  GASTROINTESTINAL: Negative for abdominal pain,, diarrhea, nausea and vomiting.  MSK: Negative for arthralgias and myalgias.  INTEGUMENTARY: Negative for rash.  NEUROLOGICAL:see HPI     Objective:  PHYSICAL EXAM:   VS: BP 118/74 (BP Location: Left Arm, Patient Position: Sitting)   Pulse 70   Temp 97.9 F (36.6 C) (Temporal)   Ht $R'5\' 11"'Nt$  (1.803 m)   Wt 154 lb (69.9 kg)   SpO2 98%   BMI 21.48 kg/m   GEN: pt sitting on exam table with sunglasses - appears in mild discomfort HEENT: normal external ears and nose - normal external auditory canals and TMS - hearing grossly normal -  - Lips, Teeth and Gums - normal  Oropharynx - normal mucosa,  palate, and posterior pharynx  Cardiac: RRR; no murmurs, rubs,  Respiratory:  normal respiratory rate and pattern with no distress - normal breath sounds with no rales, rhonchi, wheezes or rubs  Skin: warm and dry, no rash  Neuro:  Alert and Oriented x 3, Strength and sensation are intact - CN II-Xii grossly intact - no neurologic  deficits noted   Health Maintenance Due  Topic Date Due   TETANUS/TDAP  Never done   COLONOSCOPY (Pts 45-53yr Insurance coverage will need to be confirmed)  Never done   Zoster Vaccines- Shingrix (1 of 2) Never done   INFLUENZA VACCINE  06/29/2022    There are no preventive care reminders to display for this patient.   Lab Results  Component Value Date   TSH 1.030 07/28/2022   Lab Results  Component Value Date   WBC 10.4 08/08/2022   HGB 15.8 08/08/2022   HCT 46.2 08/08/2022   MCV 96.0 08/08/2022   PLT 228 08/08/2022   Lab Results  Component Value Date   NA 142 08/08/2022   K 3.9 08/08/2022   CO2 25 08/08/2022   GLUCOSE 105 (H) 08/08/2022   BUN 11 08/08/2022   CREATININE 1.02 08/08/2022   BILITOT 0.5 08/08/2022   ALKPHOS 48 08/08/2022   AST 16 08/08/2022   ALT 27 08/08/2022   PROT 6.4 (L) 08/08/2022   ALBUMIN 3.8 08/08/2022   CALCIUM 9.1 08/08/2022   ANIONGAP 8 08/08/2022   EGFR 99 07/28/2022   Lab Results  Component Value Date   CHOL 234 (H) 09/28/2021   Lab Results  Component Value Date   HDL 47 09/28/2021   Lab Results  Component Value Date   LDLCALC 170 (H) 09/28/2021   Lab Results  Component Value Date   TRIG 94 09/28/2021   Lab Results  Component Value Date   CHOLHDL 5.0 09/28/2021   No results found for: "HGBA1C"     Assessment & Plan:   Problem List Items Addressed This Visit       Other   New persistent daily headache - Primary - concern for temporal arteritis   Relevant Medications   traMADol (ULTRAM) 50 MG tablet   Other Relevant Orders   Sedimentation rate - stat Recommend to stop fioricet and  will send in tramadol Continue topamax and augmentin Pt had prednisone sent to pharmacy but has not started ---- advise to take 258mtid  Pt referred to see opthalmologist - will see Dr WaGilford Rileomorrow Pt referred to surgeon - plan to see Dr MoLilia Proriday for biopsy to rule out temporal arteritis    Neuralgia   Relevant Medications   traMADol (ULTRAM) 50 MG tablet   Other Relevant Orders   Sedimentation rate   Meds ordered this encounter  Medications   traMADol (ULTRAM) 50 MG tablet    Sig: Take 1 tablet (50 mg total) by mouth every 6 (six) hours as needed for up to 5 days.    Dispense:  20 tablet    Refill:  0    Order Specific Question:   Supervising Provider    Answer:   COShelton Silvas predniSONE (DELTASONE) 20 MG tablet    Sig: Take 1 tablet (20 mg total) by mouth in the morning, at noon, and at bedtime.    Dispense:  30 tablet    Refill:  0    Order Specific Question:   Supervising Provider    Answer: Shelton Silvas  Orders Placed This Encounter  Procedures   Sedimentation rate   Ambulatory referral to General Surgery     Follow-up: Return if symptoms worsen or fail to improve.  An After Visit Summary was printed and given to the patient.  [facility] ([phone]

## 2022-08-13 DIAGNOSIS — R519 Headache, unspecified: Secondary | ICD-10-CM | POA: Diagnosis not present

## 2022-08-16 ENCOUNTER — Other Ambulatory Visit: Payer: Self-pay | Admitting: Physician Assistant

## 2022-08-16 MED ORDER — PREDNISONE 20 MG PO TABS: 20.0000 mg | ORAL_TABLET | Freq: Three times a day (TID) | ORAL | 0 refills | Status: DC

## 2022-08-17 DIAGNOSIS — I7789 Other specified disorders of arteries and arterioles: Secondary | ICD-10-CM | POA: Diagnosis not present

## 2022-08-17 DIAGNOSIS — J449 Chronic obstructive pulmonary disease, unspecified: Secondary | ICD-10-CM | POA: Diagnosis not present

## 2022-08-17 DIAGNOSIS — R519 Headache, unspecified: Secondary | ICD-10-CM | POA: Diagnosis not present

## 2022-08-17 DIAGNOSIS — F17210 Nicotine dependence, cigarettes, uncomplicated: Secondary | ICD-10-CM | POA: Diagnosis not present

## 2022-08-26 ENCOUNTER — Other Ambulatory Visit: Payer: Self-pay | Admitting: Nurse Practitioner

## 2022-08-26 ENCOUNTER — Ambulatory Visit (INDEPENDENT_AMBULATORY_CARE_PROVIDER_SITE_OTHER): Payer: Medicare Other | Admitting: Nurse Practitioner

## 2022-08-26 ENCOUNTER — Encounter: Payer: Self-pay | Admitting: Nurse Practitioner

## 2022-08-26 VITALS — BP 130/82 | HR 76 | Temp 97.8°F | Resp 14 | Ht 71.0 in | Wt 150.0 lb

## 2022-08-26 DIAGNOSIS — G43011 Migraine without aura, intractable, with status migrainosus: Secondary | ICD-10-CM

## 2022-08-26 DIAGNOSIS — I2510 Atherosclerotic heart disease of native coronary artery without angina pectoris: Secondary | ICD-10-CM | POA: Diagnosis not present

## 2022-08-26 DIAGNOSIS — I10 Essential (primary) hypertension: Secondary | ICD-10-CM | POA: Diagnosis not present

## 2022-08-26 DIAGNOSIS — E782 Mixed hyperlipidemia: Secondary | ICD-10-CM

## 2022-08-26 DIAGNOSIS — Z125 Encounter for screening for malignant neoplasm of prostate: Secondary | ICD-10-CM | POA: Diagnosis not present

## 2022-08-26 DIAGNOSIS — F17218 Nicotine dependence, cigarettes, with other nicotine-induced disorders: Secondary | ICD-10-CM | POA: Diagnosis not present

## 2022-08-26 DIAGNOSIS — F17200 Nicotine dependence, unspecified, uncomplicated: Secondary | ICD-10-CM | POA: Insufficient documentation

## 2022-08-26 DIAGNOSIS — R7303 Prediabetes: Secondary | ICD-10-CM

## 2022-08-26 MED ORDER — UBRELVY 100 MG PO TABS: 100.0000 mg | ORAL_TABLET | Freq: Once | ORAL | 1 refills | Status: DC | PRN

## 2022-08-26 MED ORDER — TOPIRAMATE 25 MG PO TABS: 25.0000 mg | ORAL_TABLET | Freq: Two times a day (BID) | ORAL | 1 refills | Status: DC

## 2022-08-26 MED ORDER — MECLIZINE HCL 25 MG PO TABS: 25.0000 mg | ORAL_TABLET | Freq: Three times a day (TID) | ORAL | 0 refills | Status: DC | PRN

## 2022-08-26 MED ORDER — RIZATRIPTAN BENZOATE 10 MG PO TABS: 10.0000 mg | ORAL_TABLET | ORAL | 3 refills | Status: DC | PRN

## 2022-08-26 MED ORDER — UBRELVY 100 MG PO TABS: 100.0000 mg | ORAL_TABLET | Freq: Once | ORAL | 0 refills | Status: DC | PRN

## 2022-08-26 NOTE — Progress Notes (Signed)
Subjective:  Patient ID: Kurt Wagner, male    DOB: 03-01-1961  Age: 61 y.o. MRN: [id number]  Chief Complaint  Patient presents with   Hyperlipidemia   Hypertension    HPI  Pt presents for follow-up of hyperlipidemia, HTN, GERD, and migraine headaches. He is accompanied by his spouse. Pt had an MI 05/12/22, PCI with cardiac stent placed. Currently taking Plavix 75 mg and ASA 81 mg. He is followed by Dr Orlinda Blalock, cardiologist. He is a 50 year ppd cigarette smoker. Stopped briefly, states he resumed smoking because he has headaches. Encouraged cessation. States he has nicotine patches at home. Denies cp, dyspnea. He has chronic headaches.  Lipid/Cholesterol, Follow-up  Last lipid panel Other pertinent labs  Lab Results  Component Value Date   CHOL 234 (H) 09/28/2021   HDL 47 09/28/2021   LDLCALC 170 (H) 09/28/2021   TRIG 94 09/28/2021   CHOLHDL 5.0 09/28/2021   Lab Results  Component Value Date   ALT 27 08/08/2022   AST 16 08/08/2022   PLT 228 08/08/2022   TSH 1.030 07/28/2022     He was last seen for this 6 months ago.  Management includes Atorvastatin 80 mg and Zetia 10 mg. He reports excellent compliance with treatment. He is not having side effects.  Current diet: well balanced Current exercise: none   Hypertension, follow-up: He was last seen for hypertension 6 months ago.  BP at that visit was 108/60. Management includes Lisinopril 10 mg and ASA 81 mg QD.  He reports excellent compliance with treatment. He is not having side effects.  He is following a Regular diet. He is not exercising. He does smoke. Use of agents associated with hypertension: NSAIDS.  Outside blood pressures are not being checked.  Pertinent labs: Lab Results  Component Value Date   CHOL 234 (H) 09/28/2021   HDL 47 09/28/2021   LDLCALC 170 (H) 09/28/2021   TRIG 94 09/28/2021   CHOLHDL 5.0 09/28/2021   Lab Results  Component Value Date   NA 142 08/08/2022   K 3.9 08/08/2022    CREATININE 1.02 08/08/2022   EGFR 99 07/28/2022   GFRNONAA >60 08/08/2022   GLUCOSE 105 (H) 08/08/2022     The ASCVD Risk score (Arnett DK, et al., 2019) failed to calculate for the following reasons:   The patient has a prior MI or stroke diagnosis     GERD, Follow up:  The patient was last seen for GERD 6 months ago. Current treatment consist GT:XMIWOEHO 40 mg QD  He reports good compliance with treatment. He is not having side effects. Marland Kitchen  He IS experiencing heartburn. He is NOT experiencing choking on food or difficulty swallowing     Migraine: He is taking Tramadol 50 mg twice a day PRN, Topiramate 25  mg twice a day, Prednisone 20 mg twice a day. Temporal artery biopsy negative for temporal arteritis. MRI of brain and oribits negative. Pt was seen by Dr Gilford Rile, optometrist for routine eye exam. Pt has pending neurology appt Nov 2023. States migraine headaches are not currently well-controlled. Current Outpatient Medications on File Prior to Visit  Medication Sig Dispense Refill   aspirin EC 81 MG tablet Take 1 tablet by mouth daily.     atorvastatin (LIPITOR) 80 MG tablet Take 80 mg by mouth daily.     clopidogrel (PLAVIX) 75 MG tablet Take by mouth.     ezetimibe (ZETIA) 10 MG tablet Take 1 tablet (10 mg total) by mouth  daily. 90 tablet 0   lisinopril (ZESTRIL) 20 MG tablet TAKE 1/2 TABLET (10 MG) BY MOUTH ONCE DAILY. 90 tablet 2   nitroGLYCERIN (NITROSTAT) 0.4 MG SL tablet Place 1 tablet (0.4 mg total) under the tongue every 5 (five) minutes as needed for chest pain. 25 tablet 6   oxyCODONE (OXY IR/ROXICODONE) 5 MG immediate release tablet SMARTSIG:5 Milligram(s) By Mouth Every 4 Hours PRN     pantoprazole (PROTONIX) 40 MG tablet Take 1 tablet by mouth daily.     predniSONE (DELTASONE) 20 MG tablet Take 1 tablet (20 mg total) by mouth 3 (three) times daily. 30 tablet 0   topiramate (TOPAMAX) 25 MG tablet Take 1 tablet (25 mg total) by mouth 2 (two) times daily. 60 tablet 0    traMADol (ULTRAM) 50 MG tablet Take 50 mg by mouth every 6 (six) hours as needed for moderate pain.     No current facility-administered medications on file prior to visit.   Past Medical History:  Diagnosis Date   Ectopic atrial rhythm 12/26/2019   Essential (primary) hypertension    Essential hypertension 12/26/2019   Gastro-esophageal reflux disease with esophagitis    Intervertebral disc disorders with myelopathy of lumbar region    Lipoma of back 06/24/2016   Mixed hyperlipidemia    Nicotine dependence unspecified, with withdrawal    Precordial pain 12/26/2019   Tobacco use 12/26/2019   Past Surgical History:  Procedure Laterality Date   APPENDECTOMY     CHEST SURGERY     Tumor inside chest   CORONARY STENT PLACEMENT  05/12/2022   Xience Skypoint 3.0 mm x 33 mmCSN: [id number] UDI-[id number] REF: [id number], [id number]   HERNIA REPAIR     LUMBAR [place]     X3   TUMOR EXCISION     From back    Family History  Problem Relation Age of Onset   Hypertension Mother    Arthritis Mother    Atrial fibrillation Father    Social History   Socioeconomic History   Marital status: Married    Spouse name: Not on file   Number of children: Not on file   Years of education: Not on file   Highest education level: Not on file  Occupational History   Not on file  Tobacco Use   Smoking status: Every Day    Packs/day: 1.00    Years: 49.00    Total pack years: 49.00    Types: Cigarettes    Last attempt to quit: 05/24/2022    Years since quitting: 0.2   Smokeless tobacco: Former  Scientific laboratory technician Use: Every day  Substance and Sexual Activity   Alcohol use: Yes    Alcohol/week: 12.0 standard drinks of alcohol    Types: 12 Cans of beer per week   Drug use: Never   Sexual activity: Yes    Partners: Female  Other Topics Concern   Not on file  Social History Narrative   Not on file   Social Determinants of Health   Financial Resource Strain: Not on  file  Food Insecurity: Not on file  Transportation Needs: Not on file  Physical Activity: Not on file  Stress: Not on file  Social Connections: Not on file    Review of Systems  Constitutional:  Negative for chills, fatigue, fever and unexpected weight change.  HENT:  Positive for congestion. Negative for ear pain, sinus pain and sore throat.   Respiratory:  Negative for choking and shortness  of breath.   Cardiovascular:  Negative for chest pain and palpitations.  Gastrointestinal:  Negative for abdominal pain, blood in stool, constipation, diarrhea, nausea and vomiting.  Endocrine: Negative for polydipsia.  Genitourinary:  Negative for dysuria.  Musculoskeletal:  Negative for back pain.  Skin:  Negative for rash.  Neurological:  Positive for headaches.  Psychiatric/Behavioral:  Positive for sleep disturbance (insomnia).      Objective:  BP 130/82   Pulse 76   Temp 97.8 F (36.6 C)   Resp 14   Ht _0  (1.803 m)   Wt 150 lb (68 kg)   SpO2 98%   BMI 20.92 kg/m      08/26/2022    8:05 AM 08/11/2022   10:54 AM 08/09/2022    3:35 AM  BP/Weight  Systolic BP 433 295 188  Diastolic BP 82 74 77  Wt. (Lbs) 150 154   BMI 20.92 kg/m2 21.48 kg/m2     Physical Exam Vitals reviewed.  Constitutional:      Appearance: Normal appearance.  HENT:     Right Ear: Tympanic membrane normal.     Left Ear: Tympanic membrane normal.     Nose: Nose normal.     Mouth/Throat:     Mouth: Mucous membranes are moist.  Cardiovascular:     Rate and Rhythm: Normal rate and regular rhythm.     Pulses: Normal pulses.     Heart sounds: Normal heart sounds.  Pulmonary:     Effort: Pulmonary effort is normal.     Breath sounds: Normal breath sounds.  Abdominal:     General: Bowel sounds are normal.     Palpations: Abdomen is soft.  Skin:    General: Skin is warm and dry.     Capillary Refill: Capillary refill takes less than 2 seconds.  Neurological:     General: No focal deficit  present.     Mental Status: He is alert and oriented to person, place, and time.  Psychiatric:        Mood and Affect: Mood normal.        Behavior: Behavior normal.    Lab Results  Component Value Date   WBC 10.4 08/08/2022   HGB 15.8 08/08/2022   HCT 46.2 08/08/2022   PLT 228 08/08/2022   GLUCOSE 105 (H) 08/08/2022   CHOL 234 (H) 09/28/2021   TRIG 94 09/28/2021   HDL 47 09/28/2021   LDLCALC 170 (H) 09/28/2021   ALT 27 08/08/2022   AST 16 08/08/2022   NA 142 08/08/2022   K 3.9 08/08/2022   CL 109 08/08/2022   CREATININE 1.02 08/08/2022   BUN 11 08/08/2022   CO2 25 08/08/2022   TSH 1.030 07/28/2022      Assessment & Plan:   1. Primary hypertension-well controlled - Comprehensive metabolic panel - CBC with Differential/Platelet -continue Lisinopril 10 mg QD  2. Intractable migraine without aura and with status migrainosus-not at goal - Comprehensive metabolic panel - CBC with Differential/Platelet - meclizine (ANTIVERT) 25 MG tablet; Take 1 tablet (25 mg total) by mouth 3 (three) times daily as needed for dizziness.  Dispense: 30 tablet; Refill: 0 - Ubrogepant (UBRELVY) 100 MG TABS; Take 100 mg by mouth once as needed for up to 1 dose (migraine headache).  Dispense: 6 tablet; Refill: 0 - Ubrogepant (UBRELVY) 100 MG TABS; Take 100 mg by mouth once as needed for up to 1 dose (migraine headache).  Dispense: 15 tablet; Refill: 1 -follow-up with neurology as scheduled  3. Coronary artery disease involving native coronary artery of native heart without angina pectoris (HCC) - Comprehensive metabolic panel - CBC with Differential/Platelet - Lipid panel -continue Plavix 75 mg QD  4. Mixed hyperlipidemia - Comprehensive metabolic panel - CBC with Differential/Platelet - Lipid panel -continue Lipitor 80 mg and Zetia 10 mg   5. Encounter for screening for malignant neoplasm of prostate -PSA  6. Prediabetes-well controlled - Comprehensive metabolic panel - CBC with  Differential/Platelet - Hemoglobin A1c  7. Cigarette nicotine dependence with other nicotine-induced disorder - Comprehensive metabolic panel - CBC with Differential/Platelet - Lipid panel  -recommend smoking cessation   Begin Ubrelvy 100 mg as directed for migraine headache Take Meclizine as needed for dizziness Continue medications Consider setting a quit date for smoking  Follow-up: 70-month  An After Visit Summary was printed and given to the patient.  I, SRip Harbour NP, have reviewed all documentation for this visit. The documentation on 08/26/22 for the exam, diagnosis, procedures, and orders are all accurate and complete.    Signed, SRip Harbour NP CBicknell([phone]

## 2022-08-26 NOTE — Patient Instructions (Addendum)
Begin Ubrelvy 100 mg as directed for migraine headache Take Meclizine as needed for dizziness Continue medications Consider setting a quit date for smoking    Migraine Headache A migraine headache is a very strong throbbing pain on one side or both sides of your head. This type of headache can also cause other symptoms. It can last from 4 hours to 3 days. Talk with your doctor about what things may bring on (trigger) this condition. What are the causes? The exact cause of this condition is not known. This condition may be triggered or caused by: Drinking alcohol. Smoking. Taking medicines, such as: Medicine used to treat chest pain (nitroglycerin). Birth control pills. Estrogen. Some blood pressure medicines. Eating or drinking certain products. Doing physical activity. Other things that may trigger a migraine headache include: Having a menstrual period. Pregnancy. Hunger. Stress. Not getting enough sleep or getting too much sleep. Weather changes. Tiredness (fatigue). What increases the risk? Being 29-24 years old. Being male. Having a family history of migraine headaches. Being Caucasian. Having depression or anxiety. Being very overweight. What are the signs or symptoms? A throbbing pain. This pain may: Happen in any area of the head, such as on one side or both sides. Make it hard to do daily activities. Get worse with physical activity. Get worse around bright lights or loud noises. Other symptoms may include: Feeling sick to your stomach (nauseous). Vomiting. Dizziness. Being sensitive to bright lights, loud noises, or smells. Before you get a migraine headache, you may get warning signs (an aura). An aura may include: Seeing flashing lights or having blind spots. Seeing bright spots, halos, or zigzag lines. Having tunnel vision or blurred vision. Having numbness or a tingling feeling. Having trouble talking. Having weak muscles. Some people have symptoms  after a migraine headache (postdromal phase), such as: Tiredness. Trouble thinking (concentrating). How is this treated? Taking medicines that: Relieve pain. Relieve the feeling of being sick to your stomach. Prevent migraine headaches. Treatment may also include: Having acupuncture. Avoiding foods that bring on migraine headaches. Learning ways to control your body functions (biofeedback). Therapy to help you know and deal with negative thoughts (cognitive behavioral therapy). Follow these instructions at home: Medicines Take over-the-counter and prescription medicines only as told by your doctor. Ask your doctor if the medicine prescribed to you: Requires you to avoid driving or using heavy machinery. Can cause trouble pooping (constipation). You may need to take these steps to prevent or treat trouble pooping: Drink enough fluid to keep your pee (urine) pale yellow. Take over-the-counter or prescription medicines. Eat foods that are high in fiber. These include beans, whole grains, and fresh fruits and vegetables. Limit foods that are high in fat and sugar. These include fried or sweet foods. Lifestyle Do not drink alcohol. Do not use any products that contain nicotine or tobacco, such as cigarettes, e-cigarettes, and chewing tobacco. If you need help quitting, ask your doctor. Get at least 8 hours of sleep every night. Limit and deal with stress. General instructions     Keep a journal to find out what may bring on your migraine headaches. For example, write down: What you eat and drink. How much sleep you get. Any change in what you eat or drink. Any change in your medicines. If you have a migraine headache: Avoid things that make your symptoms worse, such as bright lights. It may help to lie down in a dark, quiet room. Do not drive or use heavy machinery. Ask your  doctor what activities are safe for you. Keep all follow-up visits as told by your doctor. This is  important. Contact a doctor if: You get a migraine headache that is different or worse than others you have had. You have more than 15 headache days in one month. Get help right away if: Your migraine headache gets very bad. Your migraine headache lasts longer than 72 hours. You have a fever. You have a stiff neck. You have trouble seeing. Your muscles feel weak or like you cannot control them. You start to lose your balance a lot. You start to have trouble walking. You pass out (faint). You have a seizure. Summary A migraine headache is a very strong throbbing pain on one side or both sides of your head. These headaches can also cause other symptoms. This condition may be treated with medicines and changes to your lifestyle. Keep a journal to find out what may bring on your migraine headaches. Contact a doctor if you get a migraine headache that is different or worse than others you have had. Contact your doctor if you have more than 15 headache days in a month. This information is not intended to replace advice given to you by your health care provider. Make sure you discuss any questions you have with your health care provider. Document Revised: 03/09/2019 Document Reviewed: 12/28/2018 Elsevier Patient Education  [place] of Quitting Smoking Quitting smoking is a physical and mental challenge. You may have cravings, withdrawal symptoms, and temptation to smoke. Before quitting, work with your health care provider to make a plan that can help you manage quitting. Making a plan before you quit may keep you from smoking when you have the urge to smoke while trying to quit. How to manage lifestyle changes Managing stress Stress can make you want to smoke, and wanting to smoke may cause stress. It is important to find ways to manage your stress. You could try some of the following: Practice relaxation techniques. Breathe slowly and deeply, in through  your nose and out through your mouth. Listen to music. Soak in a bath or take a shower. Imagine a peaceful place or vacation. Get some support. Talk with family or friends about your stress. Join a support group. Talk with a counselor or therapist. Get some physical activity. Go for a walk, run, or bike ride. Play a favorite sport. Practice yoga.  Medicines Talk with your health care provider about medicines that might help you deal with cravings and make quitting easier for you. Relationships Social situations can be difficult when you are quitting smoking. To manage this, you can: Avoid parties and other social situations where people might be smoking. Avoid alcohol. Leave right away if you have the urge to smoke. Explain to your family and friends that you are quitting smoking. Ask for support and let them know you might be a bit grumpy. Plan activities where smoking is not an option. General instructions Be aware that many people gain weight after they quit smoking. However, not everyone does. To keep from gaining weight, have a plan in place before you quit, and stick to the plan after you quit. Your plan should include: Eating healthy snacks. When you have a craving, it may help to: Eat popcorn, or try carrots, celery, or other cut vegetables. Chew sugar-free gum. Changing how you eat. Eat small portion sizes at meals. Eat 4-6 small meals throughout the day instead of 1-2 large meals a  day. Be mindful when you eat. You should avoid watching television or doing other things that might distract you as you eat. Exercising regularly. Make time to exercise each day. If you do not have time for a long workout, do short bouts of exercise for 5-10 minutes several times a day. Do some form of strengthening exercise, such as weight lifting. Do some exercise that gets your heart beating and causes you to breathe deeply, such as walking fast, running, swimming, or biking. This is very  important. Drinking plenty of water or other low-calorie or no-calorie drinks. Drink enough fluid to keep your urine pale yellow.  How to recognize withdrawal symptoms Your body and mind may experience discomfort as you try to get used to not having nicotine in your system. These effects are called withdrawal symptoms. They may include: Feeling hungrier than normal. Having trouble concentrating. Feeling irritable or restless. Having trouble sleeping. Feeling depressed. Craving a cigarette. These symptoms may surprise you, but they are normal to have when quitting smoking. To manage withdrawal symptoms: Avoid places, people, and activities that trigger your cravings. Remember why you want to quit. Get plenty of sleep. Avoid coffee and other drinks that contain caffeine. These may worsen some of your symptoms. How to manage cravings Come up with a plan for how to deal with your cravings. The plan should include the following: A definition of the specific situation you want to deal with. An activity or action you will take to replace smoking. A clear idea for how this action will help. The name of someone who could help you with this. Cravings usually last for 5-10 minutes. Consider taking the following actions to help you with your plan to deal with cravings: Keep your mouth busy. Chew sugar-free gum. Suck on hard candies or a straw. Brush your teeth. Keep your hands and body busy. Change to a different activity right away. Squeeze or play with a ball. Do an activity or a hobby, such as making bead jewelry, practicing needlepoint, or working with wood. Mix up your normal routine. Take a short exercise break. Go for a quick walk, or run up and down stairs. Focus on doing something kind or helpful for someone else. Call a friend or family member to talk during a craving. Join a support group. Contact a quitline. Where to find support To get help or find a support group: Call the  [facility]'s Smoking Quitline: 1-800-QUIT-NOW 786-614-2391) Text QUIT to SmokefreeTXT: 939030 Where to find more information Visit these websites to find more information on quitting smoking: U.S. Department of Health and Human Services: www.smokefree.gov American Lung Association: www.freedomfromsmoking.org Centers for Disease Control and Prevention (CDC): http://www.wolf.info/ American Heart Association: www.heart.org Contact a health care provider if: You want to change your plan for quitting. The medicines you are taking are not helping. Your eating feels out of control or you cannot sleep. You feel depressed or become very anxious. Summary Quitting smoking is a physical and mental challenge. You will face cravings, withdrawal symptoms, and temptation to smoke again. Preparation can help you as you go through these challenges. Try different techniques to manage stress, handle social situations, and prevent weight gain. You can deal with cravings by keeping your mouth busy (such as by chewing gum), keeping your hands and body busy, calling family or friends, or contacting a quitline for people who want to quit smoking. You can deal with withdrawal symptoms by avoiding places where people smoke, getting plenty of rest, and  avoiding drinks that contain caffeine. This information is not intended to replace advice given to you by your health care provider. Make sure you discuss any questions you have with your health care provider. Document Revised: 11/06/2021 Document Reviewed: 11/06/2021 Elsevier Patient Education  [place].

## 2022-08-27 LAB — COMPREHENSIVE METABOLIC PANEL
ALT: 28 IU/L (ref 0–44)
AST: 13 IU/L (ref 0–40)
Albumin/Globulin Ratio: 2 (ref 1.2–2.2)
Albumin: 4.5 g/dL (ref 3.8–4.9)
Alkaline Phosphatase: 62 IU/L (ref 44–121)
BUN/Creatinine Ratio: 21 (ref 10–24)
BUN: 22 mg/dL (ref 8–27)
Bilirubin Total: 0.3 mg/dL (ref 0.0–1.2)
CO2: 22 mmol/L (ref 20–29)
Calcium: 9.6 mg/dL (ref 8.6–10.2)
Chloride: 103 mmol/L (ref 96–106)
Creatinine, Ser: 1.05 mg/dL (ref 0.76–1.27)
Globulin, Total: 2.3 g/dL (ref 1.5–4.5)
Glucose: 111 mg/dL — ABNORMAL HIGH (ref 70–99)
Potassium: 4.9 mmol/L (ref 3.5–5.2)
Sodium: 140 mmol/L (ref 134–144)
Total Protein: 6.8 g/dL (ref 6.0–8.5)
eGFR: 81 mL/min/{1.73_m2} (ref >59)

## 2022-08-27 LAB — CARDIOVASCULAR RISK ASSESSMENT

## 2022-08-27 LAB — CBC WITH DIFFERENTIAL/PLATELET
Basophils Absolute: 0 10*3/uL (ref 0.0–0.2)
Basos: 0 %
EOS (ABSOLUTE): 0 10*3/uL (ref 0.0–0.4)
Eos: 0 %
Hematocrit: 47.8 % (ref 37.5–51.0)
Hemoglobin: 16.5 g/dL (ref 13.0–17.7)
Immature Grans (Abs): 0.1 10*3/uL (ref 0.0–0.1)
Immature Granulocytes: 1 %
Lymphocytes Absolute: 0.9 10*3/uL (ref 0.7–3.1)
Lymphs: 9 %
MCH: 32.6 pg (ref 26.6–33.0)
MCHC: 34.5 g/dL (ref 31.5–35.7)
MCV: 95 fL (ref 79–97)
Monocytes Absolute: 0.8 10*3/uL (ref 0.1–0.9)
Monocytes: 8 %
Neutrophils Absolute: 8.6 10*3/uL — ABNORMAL HIGH (ref 1.4–7.0)
Neutrophils: 82 %
Platelets: 182 10*3/uL (ref 150–450)
RBC: 5.06 x10E6/uL (ref 4.14–5.80)
RDW: 13.2 % (ref 11.6–15.4)
WBC: 10.4 10*3/uL (ref 3.4–10.8)

## 2022-08-27 LAB — LIPID PANEL
Chol/HDL Ratio: 2.2 ratio (ref 0.0–5.0)
Cholesterol, Total: 104 mg/dL (ref 100–199)
HDL: 47 mg/dL (ref >39)
LDL Chol Calc (NIH): 43 mg/dL (ref 0–99)
Triglycerides: 64 mg/dL (ref 0–149)
VLDL Cholesterol Cal: 14 mg/dL (ref 5–40)

## 2022-08-27 LAB — HEMOGLOBIN A1C
Est. average glucose Bld gHb Est-mCnc: 126 mg/dL
Hgb A1c MFr Bld: 6 % — ABNORMAL HIGH (ref 4.8–5.6)

## 2022-08-28 LAB — PSA: Prostate Specific Ag, Serum: 2.3 ng/mL (ref 0.0–4.0)

## 2022-09-08 ENCOUNTER — Telehealth: Payer: Self-pay

## 2022-09-08 NOTE — Telephone Encounter (Signed)
Mrs. Provence called with concerns that [facility]'s neurology appointmen is scheduled for November and he is very uncomfortable with his headaches.  Jerrell Belfast, NP advised that he pick-up samples of Ubrelvy.  If symptoms worsen or do not improve he will need to go to the ED for evaluation and treatment.  Wife will pick-up samples.

## 2022-09-24 DIAGNOSIS — R519 Headache, unspecified: Secondary | ICD-10-CM | POA: Diagnosis not present

## 2022-09-24 DIAGNOSIS — M545 Low back pain, unspecified: Secondary | ICD-10-CM | POA: Diagnosis not present

## 2022-09-24 DIAGNOSIS — M542 Cervicalgia: Secondary | ICD-10-CM | POA: Diagnosis not present

## 2022-09-27 DIAGNOSIS — J449 Chronic obstructive pulmonary disease, unspecified: Secondary | ICD-10-CM | POA: Diagnosis not present

## 2022-09-27 DIAGNOSIS — R519 Headache, unspecified: Secondary | ICD-10-CM | POA: Diagnosis not present

## 2022-09-27 DIAGNOSIS — I10 Essential (primary) hypertension: Secondary | ICD-10-CM | POA: Diagnosis not present

## 2022-09-30 NOTE — Progress Notes (Signed)
Subjective:  Patient ID: Kurt Wagner, male    DOB: 08/26/1961  Age: 61 y.o. MRN: [id number]  Chief Complaint  Patient presents with   Follow-up    HPI  Pt presents for hospital follow-up for chronic headache. Pt reports he was prescribed Prednisone 10 mg QOD by Dr Ermalene Postin, neurologist at [facility]. Pt states treatment was based on previous imaging studies that revealed cervical stenosis and bone spurs. Pt denies current neck pain or headaches. States he has discontinued Prednisone on his own. Reports he has experienced less severe intermittent headaches after d/c of Prednisone. Treated headaches with Tylenol and Ibuprofen with good relief. He has requested an alternative neurology referral for a second opinion for source of chronic headaches. Pt is scheduled to begin PT at [facility] to help with headaches.  Follow up ER visit  Patient was seen in ER for headache on 09/27/2022. He was treated for cluster headache. Treatment for this included phenergan, Tramadol, and Tylenol He reports good compliance with treatment. He reports this condition is Improved.  Current Outpatient Medications on File Prior to Visit  Medication Sig Dispense Refill   aspirin EC 81 MG tablet Take 1 tablet by mouth daily.     atorvastatin (LIPITOR) 80 MG tablet Take 80 mg by mouth daily.     clopidogrel (PLAVIX) 75 MG tablet Take by mouth.     ezetimibe (ZETIA) 10 MG tablet Take 1 tablet (10 mg total) by mouth daily. 90 tablet 0   lisinopril (ZESTRIL) 20 MG tablet TAKE 1/2 TABLET (10 MG) BY MOUTH ONCE DAILY. (Patient taking differently: Take 10 mg by mouth daily.) 90 tablet 2   meclizine (ANTIVERT) 25 MG tablet Take 1 tablet (25 mg total) by mouth 3 (three) times daily as needed for dizziness. 30 tablet 0   nitroGLYCERIN (NITROSTAT) 0.4 MG SL tablet Place 1 tablet (0.4 mg total) under the tongue every 5 (five) minutes as needed for chest pain. 25 tablet 6   oxyCODONE (OXY IR/ROXICODONE) 5  MG immediate release tablet SMARTSIG:5 Milligram(s) By Mouth Every 4 Hours PRN     pantoprazole (PROTONIX) 40 MG tablet Take 1 tablet by mouth daily.     predniSONE (DELTASONE) 20 MG tablet Take 1 tablet (20 mg total) by mouth 3 (three) times daily. (Patient taking differently: Take 20 mg by mouth 2 (two) times daily with a meal.) 30 tablet 0   topiramate (TOPAMAX) 25 MG tablet Take 1 tablet (25 mg total) by mouth 2 (two) times daily. 60 tablet 1   traMADol (ULTRAM) 50 MG tablet Take 50 mg by mouth every 6 (six) hours as needed for moderate pain.     Ubrogepant (UBRELVY) 100 MG TABS Take 100 mg by mouth once as needed for up to 1 dose (migraine headache). 6 tablet 0   Ubrogepant (UBRELVY) 100 MG TABS Take 100 mg by mouth once as needed for up to 1 dose (migraine headache). 15 tablet 1   No current facility-administered medications on file prior to visit.   Past Medical History:  Diagnosis Date   Ectopic atrial rhythm 12/26/2019   Essential (primary) hypertension    Essential hypertension 12/26/2019   Gastro-esophageal reflux disease with esophagitis    Intervertebral disc disorders with myelopathy of lumbar region    Lipoma of back 06/24/2016   Mixed hyperlipidemia    Nicotine dependence unspecified, with withdrawal    Precordial pain 12/26/2019   Tobacco use 12/26/2019   Past Surgical History:  Procedure Laterality Date   APPENDECTOMY     CHEST SURGERY     Tumor inside chest   CORONARY STENT PLACEMENT  05/12/2022   Xience Skypoint 3.0 mm x 33 mmCSN: [id number] UDI-[id number] REF: [id number], LOT [id number]   HERNIA REPAIR     LUMBAR DISC SURGERY     X3   TUMOR EXCISION     From back    Family History  Problem Relation Age of Onset   Hypertension Mother    Arthritis Mother    Atrial fibrillation Father    Social History   Socioeconomic History   Marital status: Married    Spouse name: Not on file   Number of children: Not on file   Years of education: Not on file    Highest education level: Not on file  Occupational History   Not on file  Tobacco Use   Smoking status: Every Day    Packs/day: 1.00    Years: 49.00    Total pack years: 49.00    Types: Cigarettes    Last attempt to quit: 05/24/2022    Years since quitting: 0.3   Smokeless tobacco: Former  Scientific laboratory technician Use: Every day  Substance and Sexual Activity   Alcohol use: Yes    Alcohol/week: 12.0 standard drinks of alcohol    Types: 12 Cans of beer per week   Drug use: Never   Sexual activity: Yes    Partners: Female  Other Topics Concern   Not on file  Social History Narrative   Not on file   Social Determinants of Health   Financial Resource Strain: Not on file  Food Insecurity: Not on file  Transportation Needs: Not on file  Physical Activity: Not on file  Stress: Not on file  Social Connections: Not on file    Review of Systems  Constitutional:  Negative for fatigue.  HENT:  Negative for congestion, ear pain and sore throat.   Respiratory:  Negative for cough and shortness of breath.   Cardiovascular:  Negative for chest pain.  Gastrointestinal:  Negative for abdominal pain, constipation, diarrhea, nausea and vomiting.  Genitourinary:  Negative for dysuria, frequency and urgency.  Musculoskeletal:  Negative for arthralgias, back pain and myalgias.  Neurological:  Positive for headaches (chronic, intermittent). Negative for dizziness.  Psychiatric/Behavioral:  Negative for agitation and sleep disturbance. The patient is not nervous/anxious.      Objective:  BP 130/84 (BP Location: Left Arm, Patient Position: Sitting, Cuff Size: Normal)   Pulse 72   Temp (!) 97 F (36.1 C) (Temporal)   Wt 157 lb (71.2 kg)   SpO2 99%   BMI 21.90 kg/m       08/26/2022    8:05 AM 08/11/2022   10:54 AM 08/09/2022    3:35 AM  BP/Weight  Systolic BP 710 626 948  Diastolic BP 82 74 77  Wt. (Lbs) 150 154   BMI 20.92 kg/m2 21.48 kg/m2     Physical Exam Vitals reviewed.   Constitutional:      Appearance: Normal appearance.  Cardiovascular:     Rate and Rhythm: Normal rate and regular rhythm.  Pulmonary:     Effort: Pulmonary effort is normal.     Breath sounds: Normal breath sounds.  Abdominal:     General: Bowel sounds are normal.  Musculoskeletal:        General: Normal range of motion.     Cervical back: Normal range of motion.  Skin:  General: Skin is warm.  Neurological:     Mental Status: He is alert.  Psychiatric:        Mood and Affect: Mood normal.        Behavior: Behavior normal.     Lab Results  Component Value Date   WBC 10.4 08/26/2022   HGB 16.5 08/26/2022   HCT 47.8 08/26/2022   PLT 182 08/26/2022   GLUCOSE 111 (H) 08/26/2022   CHOL 104 08/26/2022   TRIG 64 08/26/2022   HDL 47 08/26/2022   LDLCALC 43 08/26/2022   ALT 28 08/26/2022   AST 13 08/26/2022   NA 140 08/26/2022   K 4.9 08/26/2022   CL 103 08/26/2022   CREATININE 1.05 08/26/2022   BUN 22 08/26/2022   CO2 22 08/26/2022   TSH 1.030 07/28/2022   HGBA1C 6.0 (H) 08/26/2022      Assessment & Plan:   1. Chronic intractable headache, unspecified headache type - Ambulatory referral to Neurology  -continue medications as prescribed -seek emergency medical care for severe headaches or other concerning symptoms -keep headache log to identify possible headache triggers  Continue medications as prescribed Seek emergency medical care for severe symptoms We will call you with neurology referral appt  Follow-up: PRN  An After Visit Summary was printed and given to the patient.  I, Rip Harbour, NP, have reviewed all documentation for this visit. The documentation on 10/01/22 for the exam, diagnosis, procedures, and orders are all accurate and complete.    Signed, Rip Harbour, NP [place] [phone]

## 2022-10-01 ENCOUNTER — Encounter: Payer: Self-pay | Admitting: Nurse Practitioner

## 2022-10-01 ENCOUNTER — Ambulatory Visit (INDEPENDENT_AMBULATORY_CARE_PROVIDER_SITE_OTHER): Payer: Medicare Other | Admitting: Nurse Practitioner

## 2022-10-01 VITALS — BP 130/84 | HR 72 | Temp 97.0°F | Wt 157.0 lb

## 2022-10-01 DIAGNOSIS — R519 Headache, unspecified: Secondary | ICD-10-CM

## 2022-10-01 DIAGNOSIS — G8929 Other chronic pain: Secondary | ICD-10-CM

## 2022-10-01 NOTE — Patient Instructions (Addendum)
Continue medications as prescribed Seek emergency medical care for severe symptoms We will call you with neurology referral appt  Form - Headache Record There are many types and causes of headaches. A headache record can help guide your treatment plan. Use this form to record the details. Bring this form with you to your follow-up visits. Follow your health care provider's instructions on how to describe your headache. You may be asked to: Use a pain scale. This is a tool to rate the intensity of your headache using words or numbers. Describe what your headache feels like, such as dull, achy, throbbing, or sharp. Headache record Date: _______________ Time (from start to end): ____________________ Location of the headache: _________________________ Intensity of the headache: ____________________ Description of the headache: ______________________________________________________________ Hours of sleep the night before the headache: __________ Food or drinks before the headache started: ______________________________________________________________________________________ Events before the headache started: _______________________________________________________________________________________________ Symptoms before the headache started: __________________________________________________________________________________________ Symptoms during the headache: __________________________________________________________________________________________________ Treatment: ________________________________________________________________________________________________________________ Effect of treatment: _________________________________________________________________________________________________________ Other comments: ___________________________________________________________________________________________________________ Date: _______________ Time (from start to end): ____________________ Location  of the headache: _________________________ Intensity of the headache: ____________________ Description of the headache: ______________________________________________________________ Hours of sleep the night before the headache: __________ Food or drinks before the headache started: ______________________________________________________________________________________ Events before the headache started: ____________________________________________________________________________________________ Symptoms before the headache started: _________________________________________________________________________________________ Symptoms during the headache: _______________________________________________________________________________________________ Treatment: ________________________________________________________________________________________________________________ Effect of treatment: _________________________________________________________________________________________________________ Other comments: ___________________________________________________________________________________________________________ Date: _______________ Time (from start to end): ____________________ Location of the headache: _________________________ Intensity of the headache: ____________________ Description of the headache: ______________________________________________________________ Hours of sleep the night before the headache: __________ Food or drinks before the headache started: ______________________________________________________________________________________ Events before the headache started: ____________________________________________________________________________________________ Symptoms before the headache started: _________________________________________________________________________________________ Symptoms during the headache:  _______________________________________________________________________________________________ Treatment: ________________________________________________________________________________________________________________ Effect of treatment: _________________________________________________________________________________________________________ Other comments: ___________________________________________________________________________________________________________ Date: _______________ Time (from start to end): ____________________ Location of the headache: _________________________ Intensity of the headache: ____________________ Description of the headache: ______________________________________________________________ Hours of sleep the night before the headache: _________ Food or drinks before the headache started: ______________________________________________________________________________________ Events before the headache started: ____________________________________________________________________________________________ Symptoms before the headache started: _________________________________________________________________________________________ Symptoms during the headache: _______________________________________________________________________________________________ Treatment: ________________________________________________________________________________________________________________ Effect of treatment: _________________________________________________________________________________________________________ Other comments: ___________________________________________________________________________________________________________ Date: _______________ Time (from start to end): ____________________ Location of the headache: _________________________ Intensity of the headache: ____________________ Description of the headache:  ______________________________________________________________ Hours of sleep the night before the headache: _________ Food or drinks before the headache started: ______________________________________________________________________________________ Events before the headache started: ____________________________________________________________________________________________ Symptoms before the headache started: _________________________________________________________________________________________ Symptoms during the headache: _______________________________________________________________________________________________ Treatment: ________________________________________________________________________________________________________________ Effect of treatment: _________________________________________________________________________________________________________ Other comments: ___________________________________________________________________________________________________________ This information is not intended to replace advice given to you by your health care provider. Make sure you discuss any questions you have with your health care provider. Document Revised: 04/15/2021 Document Reviewed: 04/15/2021 Elsevier Patient Education  [employer].   Chronic Migraine Headache A migraine headache is throbbing pain that is usually on one side of the head. Migraines that keep coming back are called recurring migraines. A migraine is called a chronic migraine if it happens at least 15 days in a month for more than 3 months. Talk with your doctor about what things may bring on (trigger) your migraines. What are the causes? The exact cause of this condition is not known. A migraine may be caused when nerves in the brain become irritated and release chemicals that cause irritation and swelling (inflammation) of blood vessels. The irritation and swelling of the blood vessels causes  pain. Migraines may be brought on  or caused by: Smoking. Foods and drinks, such as: Cheese. Chocolate. Alcohol. Caffeine. Certain substances in some foods or drinks. Some medicines. Other things that may bring on a migraine include: Periods, for women. Stress. Not enough sleep or too much sleep. Feeling very tired. Bright lights or loud noises. Smells Weather changes and being at high altitude. What increases the risk? The following factors may make you more likely to have chronic migraine: Having migraines or family members who have them. Being very sad (depressed) or feeling worried or nervous (anxious). Taking a lot of pain medicine. Having problems sleeping. Having heart disease, diabetes, or being very overweight (obese). What are the signs or symptoms? Symptoms of this condition include: Pain that feels like it throbs. Pain that is usually only on one side of the head. In some cases, the pain may be on both sides of the head or around the head or neck. Very bad pain that keeps you from doing daily activities. Pain that gets worse with activity. Feeling like you may vomit (feeling nauseous) or vomiting. Pain when you are around bright lights, loud noises, or activity. Being sensitive to bright lights, loud noises, or smells. Feeling dizzy. How is this treated? This condition is treated with: Medicines. These help to: Lessen pain and the feeling like you may vomit. Prevent migraines. Changes to your diet or sleep. Therapy. This might include: Relaxation training. Biofeedback. This is a treatment that teaches you to relax, use your brain to lower your heart rate, and control your breathing. Cognitive behavioral therapy (CBT). This therapy helps you set goals and follow up on the changes that you make. Acupuncture. Using a device that provides electrical stimulation to your nerves, which can help take away pain. Surgery, if the other treatments do not work. Follow  these instructions at home: Medicines Take over-the-counter and prescription medicines only as told by your doctor. Ask your doctor if the medicine prescribed to you requires you to avoid driving or using machinery. Lifestyle Do not use any products that contain nicotine or tobacco, such as cigarettes, e-cigarettes, and chewing tobacco. If you need help quitting, ask your doctor. Do not drink alcohol. Get 7-9 hours of sleep each night. Lower the stress in your life. Ask your doctor about ways to do this. Stay at a healthy weight. Talk with your doctor if you need help losing weight. Get regular exercise. General instructions Keep a journal to find out if certain things bring on migraines. For example, write down: What you eat and drink. How much sleep you get. Any change to your diet or medicines. Lie down in a dark, quiet room when you have a migraine. Try placing a cool towel over your head when you have a migraine. Keep lights dim if bright lights bother you or make your migraines worse. Keep all follow-up visits as told by your doctor. This is important. Where to find more information Coalition for Headache and Migraine Patients (CHAMP): headachemigraine.org American Migraine Foundation: americanmigrainefoundation.org National Headache Foundation: headaches.org Contact a doctor if: Medicine does not help your migraine. Your pain keeps coming back. Get help right away if: Your migraine becomes really bad and medicine does not help. You have a stiff neck and fever. You have trouble seeing. Your muscles are weak or you lose control of them. You lose your balance or have trouble walking. You feel like you will faint or you faint. You start having sudden, very bad headaches. You have a seizure. Summary A migraine headache is  very bad, throbbing pain that is usually on one side of the head. A chronic migraine is a migraine that happens 15 days in a month for more than 3  months. Talk with your doctor about what things may bring on your migraines. Lie down in a dark, quiet room when you have a migraine. Keep a journal. This can help you find out if certain things make you have migraines. This information is not intended to replace advice given to you by your health care provider. Make sure you discuss any questions you have with your health care provider. Document Revised: 04/29/2022 Document Reviewed: 01/02/2020 Elsevier Patient Education  [address].

## 2022-10-04 DIAGNOSIS — Z955 Presence of coronary angioplasty implant and graft: Secondary | ICD-10-CM | POA: Diagnosis not present

## 2022-10-04 DIAGNOSIS — I2102 ST elevation (STEMI) myocardial infarction involving left anterior descending coronary artery: Secondary | ICD-10-CM | POA: Diagnosis not present

## 2022-10-04 DIAGNOSIS — E7800 Pure hypercholesterolemia, unspecified: Secondary | ICD-10-CM | POA: Diagnosis not present

## 2022-10-04 DIAGNOSIS — I25110 Atherosclerotic heart disease of native coronary artery with unstable angina pectoris: Secondary | ICD-10-CM | POA: Diagnosis not present

## 2022-10-04 DIAGNOSIS — I10 Essential (primary) hypertension: Secondary | ICD-10-CM | POA: Diagnosis not present

## 2022-10-18 DIAGNOSIS — M5412 Radiculopathy, cervical region: Secondary | ICD-10-CM | POA: Diagnosis not present

## 2022-10-18 DIAGNOSIS — M542 Cervicalgia: Secondary | ICD-10-CM | POA: Diagnosis not present

## 2022-10-20 DIAGNOSIS — M542 Cervicalgia: Secondary | ICD-10-CM | POA: Diagnosis not present

## 2022-10-20 DIAGNOSIS — M5412 Radiculopathy, cervical region: Secondary | ICD-10-CM | POA: Diagnosis not present

## 2022-11-01 DIAGNOSIS — M5412 Radiculopathy, cervical region: Secondary | ICD-10-CM | POA: Diagnosis not present

## 2022-11-01 DIAGNOSIS — M542 Cervicalgia: Secondary | ICD-10-CM | POA: Diagnosis not present

## 2022-11-02 ENCOUNTER — Ambulatory Visit (INDEPENDENT_AMBULATORY_CARE_PROVIDER_SITE_OTHER): Payer: Medicare Other | Admitting: Psychiatry

## 2022-11-02 ENCOUNTER — Encounter: Payer: Self-pay | Admitting: Psychiatry

## 2022-11-02 VITALS — BP 115/75 | HR 67 | Ht 70.0 in | Wt 151.8 lb

## 2022-11-02 DIAGNOSIS — G44019 Episodic cluster headache, not intractable: Secondary | ICD-10-CM

## 2022-11-02 NOTE — Progress Notes (Signed)
Referring:  Rip Harbour, NP [address]. [place],  [place] [id number]  PCP: Rip Harbour, NP  Neurology was asked to evaluate Kurt Wagner, a 61 year old male for a chief complaint of headaches.  Our recommendations of care will be communicated by shared medical record.    CC:  headaches  History provided from self, wife  HPI:  Medical co-morbidities: CAD, STEMI s/p LAD stent, HTN, smoking, cervical radiculopathy  The patient presents for evaluation of headaches which began in August 2023. They were daily until 4 weeks ago. He is currently headache free. He would have a constant 2/10 right retro-orbital headache with more severe flare ups once per day. Severe pain would occur around the same time every evening. Severe headaches were associated with ipsilateral ptosis, lacrimation, nasal congestion and rhinorrhea. He would feel restless and rock back and forth during an episode. Severe episodes could last 4-5 hours at a time.  Had a similar episode in the 90s which lasted for one month. He was diagnosed with cluster headaches at that time. Previously took Maxalt as needed, but this was stopped after his STEMI.  MRI brain and orbits 08/08/22 was normal other than possible right maxillary sinusitis. CTA head/neck and CTV were negative for acute process but were suggestive of degenerative changes with moderate spinal canal stenosis at C5-6. He had a temporal artery biopsy in September 2023 which was negative for temporal arteritis.  He was treated with antibiotics for sinusitis but his headaches persisted. He was briefly treated with prednisone in September 2023 for presumed temporal arteritis, but felt this made his headaches worse. They improved somewhat after he stopped steroids. He was then started on Topamax 25 mg BID, which he tolerated well but is not sure if it was helpful. His headaches did resolve in November.  He saw neurology in October 2023 who referred him to  PT for cervical radiculopathy and cervicogenic headaches. He has started PT, which has helped his neck pain. He denies radicular pain or numbness/weakness in his upper extremities.   Headache History: Onset: August 2023 Triggers: none Aura: none Location: right retro-orbital Quality/Description: stabbing Associated Symptoms:  Photophobia: yes  Phonophobia: no  Nausea: no Other symptoms: runny nose on right, nasal congestion, right ptosis, right lacrimation Worse with activity?: no, restless and would rock back and forth Duration of headaches: constant low-level headache, severe pain 4-6 hours  Headache days per month: 0 Headache free days per month: 30  Current Treatment: Abortive none  Preventative Topamax 25 mg BID  Prior Therapies                                 Prevention: Metoprolol 25 mg BID Carvedilol 6.25 mg BID Lisinopril 20 mg daily Cymbalta 60 mg daily Nortriptyline 150 mg QHS Topamax 25 mg BID Prednisone - worsened headache  Rescue: ibuprofen Meloxicam Maxalt 10 mg PRN  LABS: CBC    Component Value Date/Time   WBC 10.4 08/26/2022 0855   WBC 10.4 08/08/2022 1335   RBC 5.06 08/26/2022 0855   RBC 4.81 08/08/2022 1335   HGB 16.5 08/26/2022 0855   HCT 47.8 08/26/2022 0855   PLT 182 08/26/2022 0855   MCV 95 08/26/2022 0855   MCH 32.6 08/26/2022 0855   MCH 32.8 08/08/2022 1335   MCHC 34.5 08/26/2022 0855   MCHC 34.2 08/08/2022 1335   RDW 13.2 08/26/2022 0855   LYMPHSABS  0.9 08/26/2022 0855   MONOABS 0.7 08/08/2022 1335   EOSABS 0.0 08/26/2022 0855   BASOSABS 0.0 08/26/2022 0855      Latest Ref Rng & Units 08/26/2022    8:55 AM 08/08/2022    1:35 PM 07/28/2022    2:33 PM  CMP  Glucose 70 - 99 mg/dL 111  105  114   BUN 8 - 27 mg/dL '22  11  10   '$ Creatinine 0.76 - 1.27 mg/dL 1.05  1.02  0.86   Sodium 134 - 144 mmol/L 140  142  139   Potassium 3.5 - 5.2 mmol/L 4.9  3.9  4.4   Chloride 96 - 106 mmol/L 103  109  104   CO2 20 - 29 mmol/L '22  25   24   '$ Calcium 8.6 - 10.2 mg/dL 9.6  9.1  9.5   Total Protein 6.0 - 8.5 g/dL 6.8  6.4  6.8   Total Bilirubin 0.0 - 1.2 mg/dL 0.3  0.5  0.3   Alkaline Phos 44 - 121 IU/L 62  48  70   AST 0 - 40 IU/L '13  16  14   '$ ALT 0 - 44 IU/L '28  27  15      '$ IMAGING:  MRI brain, orbits 08/08/22: 1. No acute intracranial process. No evidence of acute or subacute infarct. No abnormal parenchymal or meningeal enhancement. 2. Normal MRI of the orbits. 3. Mucosal thickening and air-fluid level with bubbly fluid in the right maxillary sinus, as can be seen with acute sinusitis. Correlate with symptoms.  CTA head/neck, CTV 08/08/22: 1.  No intracranial large vessel occlusion or significant stenosis. 2.  No hemodynamically significant stenosis in the neck. 3.  No acute intracranial process. 4. No evidence of venous sinus thrombosis or stenosis. 5. Air-fluid level with bubbly fluid in the right maxillary sinus, as can be seen with acute sinusitis. Correlate with symptoms. 6. Aortic Atherosclerosis (ICD10-I70.0) and Emphysema (ICD10-J43.9). 7. Degenerative changes in the cervical spine with at least moderate spinal canal stenosis at C5-C6. If clinically indicated, this could be further evaluated with a nonemergent MRI of the cervical spine.   Current Outpatient Medications on File Prior to Visit  Medication Sig Dispense Refill   aspirin EC 81 MG tablet Take 1 tablet by mouth daily.     atorvastatin (LIPITOR) 80 MG tablet Take 80 mg by mouth daily.     clopidogrel (PLAVIX) 75 MG tablet Take by mouth.     cyclobenzaprine (FLEXERIL) 5 MG tablet Take 5 mg by mouth daily as needed for muscle spasms.     lisinopril (ZESTRIL) 20 MG tablet TAKE 1/2 TABLET (10 MG) BY MOUTH ONCE DAILY. (Patient taking differently: Take 10 mg by mouth daily.) 90 tablet 2   meclizine (ANTIVERT) 25 MG tablet Take 1 tablet (25 mg total) by mouth 3 (three) times daily as needed for dizziness. 30 tablet 0   nitroGLYCERIN (NITROSTAT) 0.4  MG SL tablet Place 1 tablet (0.4 mg total) under the tongue every 5 (five) minutes as needed for chest pain. 25 tablet 6   pantoprazole (PROTONIX) 40 MG tablet Take 1 tablet by mouth daily.     topiramate (TOPAMAX) 25 MG tablet Take 1 tablet (25 mg total) by mouth 2 (two) times daily. 60 tablet 1   traMADol-acetaminophen (ULTRACET) 37.5-325 MG tablet SMARTSIG:1 Each By Mouth Every 4 Hours     ezetimibe (ZETIA) 10 MG tablet Take 1 tablet (10 mg total) by mouth daily. 90 tablet  0   No current facility-administered medications on file prior to visit.     Allergies: Allergies  Allergen Reactions   Azithromycin Other (See Comments)    unknown   Fentanyl Other (See Comments)    Headache   Simvastatin Other (See Comments)    Joint pain   Tape Other (See Comments)    Blisters   Varenicline Other (See Comments)    unknown    Family History: Migraine or other headaches in the family:  none Aneurysms in a first degree relative:  no Brain tumors in the family:  no Other neurological illness in the family:   no  Past Medical History: Past Medical History:  Diagnosis Date   Ectopic atrial rhythm 12/26/2019   Essential (primary) hypertension    Essential hypertension 12/26/2019   Gastro-esophageal reflux disease with esophagitis    Intervertebral disc disorders with myelopathy of lumbar region    Lipoma of back 06/24/2016   Mixed hyperlipidemia    Nicotine dependence unspecified, with withdrawal    Precordial pain 12/26/2019   Tobacco use 12/26/2019    Past Surgical History Past Surgical History:  Procedure Laterality Date   APPENDECTOMY     CHEST SURGERY     Tumor inside chest   CORONARY STENT PLACEMENT  05/12/2022   Xience Skypoint 3.0 mm x 33 mmCSN: [id number] [facility]DI-[id number] REF: [id number], LOT [id number]   HERNIA REPAIR     LUMBAR DISC SURGERY     X3   TUMOR EXCISION     From back    Social History: Social History   Tobacco Use   Smoking status: Every Day     Packs/day: 0.50    Years: 49.00    Total pack years: 24.50    Types: Cigarettes    Last attempt to quit: 05/24/2022    Years since quitting: 0.4   Smokeless tobacco: Former  Scientific laboratory technician Use: Every day  Substance Use Topics   Alcohol use: Not Currently    Alcohol/week: 12.0 standard drinks of alcohol    Types: 12 Cans of beer per week   Drug use: Never    ROS: Negative for fevers, chills. Positive for headaches. All other systems reviewed and negative unless stated otherwise in HPI.   Physical Exam:   Vital Signs: BP 115/75   Pulse 67   Ht '5\' 10"'$  (1.778 m)   Wt 151 lb 12.8 oz (68.9 kg)   BMI 21.78 kg/m  GENERAL: well appearing,in no acute distress,alert SKIN:  Color, texture, turgor normal. No rashes or lesions HEAD:  Normocephalic/atraumatic. CV:  RRR RESP: Normal respiratory effort MSK: no tenderness to palpation over occiput, neck, or shoulders  NEUROLOGICAL: Mental Status: Alert, oriented to person, place and time,Follows commands Cranial Nerves: PERRL, visual fields intact to confrontation, extraocular movements intact, facial sensation intact, no facial droop or ptosis, hearing grossly intact, no dysarthria Motor: muscle strength 5/5 both upper and lower extremities,no drift, normal tone Reflexes: 2+ throughout Sensation: intact to light touch all 4 extremities Coordination: Finger-to- nose-finger intact bilaterally Gait: normal-based   IMPRESSION: 61 year old male with a history of CAD, STEMI s/p LAD stent, HTN, smoking, cervical radiculopathy who presents for evaluation of daily right-sided headaches. Neurologic workup including MRI brain/orbits, CTA head/neck, and CTV were negative for acute intracranial process. CT was suggestive of moderate cervical stenosis, and he is currently in PT for this. His headaches are most consistent with a trigeminal autonomic cephalalgia as they were associated with multiple ipsilateral  autonomic symptoms. Suspect cluster  headache given their predictable time course and self-resolution after 3 months. Could also consider hemicrania continua as he did have a constant lower level unilateral headache during this episode. However he would not be a candidate for indomethacin trial with his history of STEMI. He cannot take triptans due to CAD/STEMI. Will try to get him set up with home oxygen for cluster headache rescue. Headaches are now resolved, but if they return would consider daily verapamil or monthly Emgality for prevention.  PLAN: -Will work on setting up home oxygen for cluster headache rescue -Consider verapamil or Emgality if headaches return -Agree with neck PT for cervicalgia   I spent a total of 61 minutes chart reviewing and counseling the patient. Headache education was done. Discussed treatment options including preventive and acute medications, and physical therapy. Discussed medication side effects, adverse reactions and drug interactions. Written educational materials and patient instructions outlining all of the above were given.  Follow-up: as needed if headaches return   Genia Harold, MD 11/02/2022   11:33 AM

## 2022-11-02 NOTE — Patient Instructions (Signed)
Stop Topamax Our office will work on getting home oxygen for you to use as needed for cluster headaches

## 2022-11-03 DIAGNOSIS — M5412 Radiculopathy, cervical region: Secondary | ICD-10-CM | POA: Diagnosis not present

## 2022-11-03 DIAGNOSIS — M542 Cervicalgia: Secondary | ICD-10-CM | POA: Diagnosis not present

## 2022-11-08 DIAGNOSIS — M542 Cervicalgia: Secondary | ICD-10-CM | POA: Diagnosis not present

## 2022-11-08 DIAGNOSIS — M5412 Radiculopathy, cervical region: Secondary | ICD-10-CM | POA: Diagnosis not present

## 2022-11-10 DIAGNOSIS — M542 Cervicalgia: Secondary | ICD-10-CM | POA: Diagnosis not present

## 2022-11-10 DIAGNOSIS — M5412 Radiculopathy, cervical region: Secondary | ICD-10-CM | POA: Diagnosis not present

## 2022-11-15 DIAGNOSIS — M5412 Radiculopathy, cervical region: Secondary | ICD-10-CM | POA: Diagnosis not present

## 2022-11-15 DIAGNOSIS — M542 Cervicalgia: Secondary | ICD-10-CM | POA: Diagnosis not present

## 2022-12-14 ENCOUNTER — Other Ambulatory Visit: Payer: Self-pay

## 2022-12-14 DIAGNOSIS — E782 Mixed hyperlipidemia: Secondary | ICD-10-CM

## 2022-12-14 MED ORDER — EZETIMIBE 10 MG PO TABS: 10.0000 mg | ORAL_TABLET | Freq: Every day | ORAL | 0 refills | Status: DC

## 2023-01-25 ENCOUNTER — Telehealth: Payer: Self-pay

## 2023-01-25 NOTE — Telephone Encounter (Signed)
I left a message on the number(s) listed in the patients chart requesting the patient to call back regarding the [place] appointment for 03/01/2023. The appointment has been canceled. Waiting for the patient to return the call.

## 2023-01-26 NOTE — Telephone Encounter (Signed)
Appointment has been scheduled for 03/09/2023 with Dr. Tobie Poet

## 2023-03-01 ENCOUNTER — Ambulatory Visit: Payer: Medicare Other | Admitting: Nurse Practitioner

## 2023-03-09 ENCOUNTER — Ambulatory Visit (INDEPENDENT_AMBULATORY_CARE_PROVIDER_SITE_OTHER): Payer: Medicare Other | Admitting: Family Medicine

## 2023-03-09 VITALS — BP 124/60 | HR 72 | Temp 96.9°F | Resp 16 | Ht 71.0 in | Wt 155.0 lb

## 2023-03-09 DIAGNOSIS — R072 Precordial pain: Secondary | ICD-10-CM

## 2023-03-09 DIAGNOSIS — Z720 Tobacco use: Secondary | ICD-10-CM

## 2023-03-09 DIAGNOSIS — E782 Mixed hyperlipidemia: Secondary | ICD-10-CM | POA: Diagnosis not present

## 2023-03-09 DIAGNOSIS — R7303 Prediabetes: Secondary | ICD-10-CM | POA: Diagnosis not present

## 2023-03-09 DIAGNOSIS — I10 Essential (primary) hypertension: Secondary | ICD-10-CM

## 2023-03-09 DIAGNOSIS — I2510 Atherosclerotic heart disease of native coronary artery without angina pectoris: Secondary | ICD-10-CM

## 2023-03-09 MED ORDER — NITROGLYCERIN 0.4 MG SL SUBL: 0.4000 mg | SUBLINGUAL_TABLET | SUBLINGUAL | 6 refills | Status: DC | PRN

## 2023-03-09 MED ORDER — BUPROPION HCL 100 MG PO TABS: 100.0000 mg | ORAL_TABLET | Freq: Three times a day (TID) | ORAL | 5 refills | Status: DC

## 2023-03-09 NOTE — Patient Instructions (Signed)
Start bupropion 100 mg one three times a day (take before supper)  Start nicoderm patches in 2 weeks. Start with nicoderm 21 mcg daily x 4 weeks, then 14 mcg daily x 2 weeks, then 7 mcg daily x 2 weeks.

## 2023-03-09 NOTE — Progress Notes (Signed)
Subjective:  Patient ID: Kurt FrockJoseph D Wagner, male    DOB: August 14, 1961  Age: 62 y.o. MRN: [id number]  Chief Complaint  Patient presents with   Hypertension   Hyperlipidemia   Gastroesophageal Reflux    HPI  CORONARY ARTERY DISEASE: Plavix 75 mg daily, Aspirin 81 mg daily. Sees Kingsley CallanderKaren Smith, MD   Hypertension:  lisinopril 20 mg 1/2 tablet daily.  Hyperlipidemia:  Zetia 10 mg daily and Lipitor 80 mg daily.   Eating healthy and exercising      05/24/2022    2:27 PM 01/07/2021    7:39 AM  Depression screen PHQ 2/9  Decreased Interest 0 0  Down, Depressed, Hopeless 0 0  PHQ - 2 Score 0 0         05/24/2022    2:26 PM 08/08/2022    1:11 PM 08/09/2022    1:06 AM  Fall Risk  Falls in the past year? 1    Was there an injury with Fall? 0    Fall Risk Category Calculator 1    Fall Risk Category (Retired) Low    (RETIRED) Patient Fall Risk Level Low fall risk Low fall risk Low fall risk  Patient at Risk for Falls Due to No Fall Risks    Fall risk Follow up Falls evaluation completed        Review of Systems  Constitutional:  Negative for chills and fever.  HENT:  Positive for congestion. Negative for rhinorrhea and sore throat.   Respiratory:  Negative for cough and shortness of breath.   Cardiovascular:  Negative for chest pain and palpitations.  Gastrointestinal:  Negative for abdominal pain, constipation, diarrhea, nausea and vomiting.  Genitourinary:  Negative for dysuria and urgency.  Musculoskeletal:  Positive for back pain. Negative for arthralgias and myalgias.  Neurological:  Positive for dizziness. Negative for headaches.  Psychiatric/Behavioral:  Negative for dysphoric mood. The patient is not nervous/anxious.     Current Outpatient Medications on File Prior to Visit  Medication Sig Dispense Refill   aspirin EC 81 MG tablet Take 1 tablet by mouth daily.     atorvastatin (LIPITOR) 80 MG tablet Take 80 mg by mouth daily.     clopidogrel (PLAVIX) 75 MG tablet Take by  mouth.     ezetimibe (ZETIA) 10 MG tablet Take 1 tablet (10 mg total) by mouth daily. 90 tablet 0   lisinopril (ZESTRIL) 20 MG tablet TAKE 1/2 TABLET (10 MG) BY MOUTH ONCE DAILY. (Patient taking differently: Take 10 mg by mouth daily.) 90 tablet 2   meclizine (ANTIVERT) 25 MG tablet Take 1 tablet (25 mg total) by mouth 3 (three) times daily as needed for dizziness. 30 tablet 0   No current facility-administered medications on file prior to visit.   Past Medical History:  Diagnosis Date   Ectopic atrial rhythm 12/26/2019   Essential (primary) hypertension    Essential hypertension 12/26/2019   Gastro-esophageal reflux disease with esophagitis    Intervertebral disc disorders with myelopathy of lumbar region    Lipoma of back 06/24/2016   Mixed hyperlipidemia    Nicotine dependence unspecified, with withdrawal    Precordial pain 12/26/2019   Status post coronary artery stent placement 05/12/2022   STEMI involving left anterior descending coronary artery 05/12/2022   Formatting of this note might be different from the original.  DES to LAD and POBA to diagonal by Dr. Reuel Boomaniel.   Tobacco use 12/26/2019   Past Surgical History:  Procedure Laterality Date   APPENDECTOMY  CHEST SURGERY     Tumor inside chest   CORONARY STENT PLACEMENT  05/12/2022   Xience Skypoint 3.0 mm x 33 mmCSN: [id number] [id number] REF: [id number], LOT [id number]   HERNIA REPAIR     LUMBAR DISC SURGERY     X3   TUMOR EXCISION     From back    Family History  Problem Relation Age of Onset   Hypertension Mother    Arthritis Mother    Diabetes Mother    Atrial fibrillation Father    Social History   Socioeconomic History   Marital status: Married    Spouse name: Not on file   Number of children: Not on file   Years of education: Not on file   Highest education level: Not on file  Occupational History   Not on file  Tobacco Use   Smoking status: Every Day    Packs/day: 0.50    Years:  49.00    Additional pack years: 0.00    Total pack years: 24.50    Types: Cigarettes    Last attempt to quit: 05/24/2022    Years since quitting: 0.8   Smokeless tobacco: Former  Building services engineer Use: Every day  Substance and Sexual Activity   Alcohol use: Not Currently    Alcohol/week: 12.0 standard drinks of alcohol    Types: 12 Cans of beer per week   Drug use: Never   Sexual activity: Yes    Partners: Female  Other Topics Concern   Not on file  Social History Narrative   Not on file   Social Determinants of Health   Financial Resource Strain: Not on file  Food Insecurity: Not on file  Transportation Needs: Not on file  Physical Activity: Not on file  Stress: Not on file  Social Connections: Not on file    Objective:  BP 124/60   Pulse 72   Temp (!) 96.9 F (36.1 C)   Resp 16   Ht 5\' 11"  (1.803 m)   Wt 155 lb (70.3 kg)   BMI 21.62 kg/m      03/09/2023   11:02 AM 11/02/2022   10:23 AM 10/01/2022   10:59 AM  BP/Weight  Systolic BP 124 115 130  Diastolic BP 60 75 84  Wt. (Lbs) 155 151.8 157  BMI 21.62 kg/m2 21.78 kg/m2 21.9 kg/m2    Physical Exam Vitals reviewed.  Constitutional:      Appearance: Normal appearance.  Neck:     Vascular: No carotid bruit.  Cardiovascular:     Rate and Rhythm: Normal rate and regular rhythm.     Heart sounds: Normal heart sounds.  Pulmonary:     Effort: Pulmonary effort is normal.     Breath sounds: Normal breath sounds. No wheezing, rhonchi or rales.  Abdominal:     General: Bowel sounds are normal.     Palpations: Abdomen is soft.     Tenderness: There is no abdominal tenderness.  Neurological:     Mental Status: Kurt Wagner is alert.  Psychiatric:        Mood and Affect: Mood normal.        Behavior: Behavior normal.     Diabetic Foot Exam - Simple   No data filed      Lab Results  Component Value Date   WBC 7.4 03/09/2023   HGB 15.8 03/09/2023   HCT 48.0 03/09/2023   PLT 173 03/09/2023   GLUCOSE 86  03/09/2023   CHOL 133  03/09/2023   TRIG 74 03/09/2023   HDL 42 03/09/2023   LDLCALC 76 03/09/2023   ALT 10 03/09/2023   AST 14 03/09/2023   NA 141 03/09/2023   K 5.0 03/09/2023   CL 103 03/09/2023   CREATININE 0.99 03/09/2023   BUN 9 03/09/2023   CO2 23 03/09/2023   TSH 1.030 07/28/2022   HGBA1C 5.8 (H) 03/09/2023      Assessment & Plan:    Primary hypertension Assessment & Plan: Well controlled.  No changes to medicines. Continue  lisinopril 20 mg 1/2 tablet daily. Continue to work on eating a healthy diet and exercise.  Labs drawn today.    Orders: -     CBC with Differential/Platelet -     Comprehensive metabolic panel  Mixed hyperlipidemia Assessment & Plan: Well controlled.  No changes to medicines. Zetia 10 mg daily and Lipitor 80 mg daily.  Continue to work on eating a healthy diet and exercise.  Labs drawn today.    Orders: -     Lipid panel  Prediabetes Assessment & Plan: Hemoglobin A1c 6.0%, 3 month avg of blood sugars, is in prediabetic range.  In order to prevent progression to diabetes, recommend low carb diet and regular exercise   Orders: -     Hemoglobin A1c  Coronary artery disease involving native coronary artery of native heart without angina pectoris Assessment & Plan: Management per specialist. Continue Plavix 75 mg daily, Aspirin 81 mg daily  Orders: -     Nitroglycerin; Place 1 tablet (0.4 mg total) under the tongue every 5 (five) minutes as needed for chest pain.  Dispense: 25 tablet; Refill: 6  Tobacco use Assessment & Plan: Start bupropion 100 mg three times a day.  Start nicoderm patches in 2 weeks. Start with nicoderm 21 mcg daily x 4 weeks, then 14 mcg daily x 2 weeks, then 7 mcg daily x 2 weeks.   Orders: -     buPROPion HCl; Take 1 tablet (100 mg total) by mouth 3 (three) times daily.  Dispense: 90 tablet; Refill: 5     Meds ordered this encounter  Medications   nitroGLYCERIN (NITROSTAT) 0.4 MG SL tablet    Sig:  Place 1 tablet (0.4 mg total) under the tongue every 5 (five) minutes as needed for chest pain.    Dispense:  25 tablet    Refill:  6   buPROPion (WELLBUTRIN) 100 MG tablet    Sig: Take 1 tablet (100 mg total) by mouth 3 (three) times daily.    Dispense:  90 tablet    Refill:  5    Orders Placed This Encounter  Procedures   CBC with Differential/Platelet   Comprehensive metabolic panel   Hemoglobin A1c   Lipid panel     Follow-up: Return in about 2 months (around 05/09/2023) for awv with KIM., chronic fasting in 6 months with Huston Foley or myself .Clayborn Bigness I Leal-Borjas,acting as a scribe for Blane Ohara, MD.,have documented all relevant documentation on the behalf of Blane Ohara, MD,as directed by  Blane Ohara, MD while in the presence of Blane Ohara, MD.    I attest that I have reviewed this visit and agree with the plan scribed by my staff.   Blane Ohara, MD [facility] [phone]

## 2023-03-10 LAB — LIPID PANEL
Chol/HDL Ratio: 3.2 ratio (ref 0.0–5.0)
Cholesterol, Total: 133 mg/dL (ref 100–199)
HDL: 42 mg/dL (ref >39)
LDL Chol Calc (NIH): 76 mg/dL (ref 0–99)
Triglycerides: 74 mg/dL (ref 0–149)
VLDL Cholesterol Cal: 15 mg/dL (ref 5–40)

## 2023-03-10 LAB — CBC WITH DIFFERENTIAL/PLATELET
Basophils Absolute: 0.1 10*3/uL (ref 0.0–0.2)
Basos: 1 %
EOS (ABSOLUTE): 0.1 10*3/uL (ref 0.0–0.4)
Eos: 2 %
Hematocrit: 48 % (ref 37.5–51.0)
Hemoglobin: 15.8 g/dL (ref 13.0–17.7)
Immature Grans (Abs): 0 10*3/uL (ref 0.0–0.1)
Immature Granulocytes: 0 %
Lymphocytes Absolute: 1.8 10*3/uL (ref 0.7–3.1)
Lymphs: 24 %
MCH: 30.6 pg (ref 26.6–33.0)
MCHC: 32.9 g/dL (ref 31.5–35.7)
MCV: 93 fL (ref 79–97)
Monocytes Absolute: 0.5 10*3/uL (ref 0.1–0.9)
Monocytes: 7 %
Neutrophils Absolute: 4.9 10*3/uL (ref 1.4–7.0)
Neutrophils: 66 %
Platelets: 173 10*3/uL (ref 150–450)
RBC: 5.17 x10E6/uL (ref 4.14–5.80)
RDW: 11.9 % (ref 11.6–15.4)
WBC: 7.4 10*3/uL (ref 3.4–10.8)

## 2023-03-10 LAB — COMPREHENSIVE METABOLIC PANEL
ALT: 10 IU/L (ref 0–44)
AST: 14 IU/L (ref 0–40)
Albumin/Globulin Ratio: 2 (ref 1.2–2.2)
Albumin: 4.5 g/dL (ref 3.8–4.9)
Alkaline Phosphatase: 99 IU/L (ref 44–121)
BUN/Creatinine Ratio: 9 — ABNORMAL LOW (ref 10–24)
BUN: 9 mg/dL (ref 8–27)
Bilirubin Total: 0.3 mg/dL (ref 0.0–1.2)
CO2: 23 mmol/L (ref 20–29)
Calcium: 9.4 mg/dL (ref 8.6–10.2)
Chloride: 103 mmol/L (ref 96–106)
Creatinine, Ser: 0.99 mg/dL (ref 0.76–1.27)
Globulin, Total: 2.3 g/dL (ref 1.5–4.5)
Glucose: 86 mg/dL (ref 70–99)
Potassium: 5 mmol/L (ref 3.5–5.2)
Sodium: 141 mmol/L (ref 134–144)
Total Protein: 6.8 g/dL (ref 6.0–8.5)
eGFR: 87 mL/min/{1.73_m2} (ref >59)

## 2023-03-10 LAB — CARDIOVASCULAR RISK ASSESSMENT

## 2023-03-10 LAB — HEMOGLOBIN A1C
Est. average glucose Bld gHb Est-mCnc: 120 mg/dL
Hgb A1c MFr Bld: 5.8 % — ABNORMAL HIGH (ref 4.8–5.6)

## 2023-03-10 NOTE — Progress Notes (Signed)
Blood count normal.  Liver function normal.  Kidney function normal.  Cholesterol: Good. HBA1C: 5.8.  Improved.

## 2023-03-12 ENCOUNTER — Encounter: Payer: Self-pay | Admitting: Family Medicine

## 2023-03-12 NOTE — Assessment & Plan Note (Signed)
Well controlled.  No changes to medicines. Zetia 10 mg daily and Lipitor 80 mg daily.  Continue to work on eating a healthy diet and exercise.  Labs drawn today.

## 2023-03-12 NOTE — Assessment & Plan Note (Signed)
Well controlled.  No changes to medicines. Continue  lisinopril 20 mg 1/2 tablet daily. Continue to work on eating a healthy diet and exercise.  Labs drawn today.

## 2023-03-12 NOTE — Assessment & Plan Note (Addendum)
Management per specialist. Continue Plavix 75 mg daily, Aspirin 81 mg daily

## 2023-03-12 NOTE — Assessment & Plan Note (Addendum)
Start bupropion 100 mg three times a day.  Start nicoderm patches in 2 weeks. Start with nicoderm 21 mcg daily x 4 weeks, then 14 mcg daily x 2 weeks, then 7 mcg daily x 2 weeks.

## 2023-03-12 NOTE — Assessment & Plan Note (Signed)
The current medical regimen is effective;  continue present plan and medications.  

## 2023-03-12 NOTE — Assessment & Plan Note (Signed)
Hemoglobin A1c 6.0%, 3 month avg of blood sugars, is in prediabetic range.  In order to prevent progression to diabetes, recommend low carb diet and regular exercise 

## 2023-03-12 NOTE — Assessment & Plan Note (Deleted)
Recommended cessation 

## 2023-03-15 ENCOUNTER — Other Ambulatory Visit: Payer: Self-pay

## 2023-03-15 DIAGNOSIS — E782 Mixed hyperlipidemia: Secondary | ICD-10-CM

## 2023-03-15 MED ORDER — EZETIMIBE 10 MG PO TABS
10.0000 mg | ORAL_TABLET | Freq: Every day | ORAL | 0 refills | Status: DC
Start: 2023-03-15 — End: 2023-09-06

## 2023-04-01 ENCOUNTER — Ambulatory Visit: Payer: Medicare Other | Admitting: Nurse Practitioner

## 2023-04-04 DIAGNOSIS — Z955 Presence of coronary angioplasty implant and graft: Secondary | ICD-10-CM | POA: Diagnosis not present

## 2023-04-04 DIAGNOSIS — I25110 Atherosclerotic heart disease of native coronary artery with unstable angina pectoris: Secondary | ICD-10-CM | POA: Diagnosis not present

## 2023-04-04 DIAGNOSIS — R001 Bradycardia, unspecified: Secondary | ICD-10-CM | POA: Diagnosis not present

## 2023-04-04 DIAGNOSIS — E782 Mixed hyperlipidemia: Secondary | ICD-10-CM | POA: Diagnosis not present

## 2023-04-04 DIAGNOSIS — I10 Essential (primary) hypertension: Secondary | ICD-10-CM | POA: Diagnosis not present

## 2023-04-04 DIAGNOSIS — R42 Dizziness and giddiness: Secondary | ICD-10-CM | POA: Diagnosis not present

## 2023-04-04 DIAGNOSIS — R0989 Other specified symptoms and signs involving the circulatory and respiratory systems: Secondary | ICD-10-CM | POA: Diagnosis not present

## 2023-04-04 DIAGNOSIS — I252 Old myocardial infarction: Secondary | ICD-10-CM | POA: Diagnosis not present

## 2023-04-22 DIAGNOSIS — R001 Bradycardia, unspecified: Secondary | ICD-10-CM | POA: Diagnosis not present

## 2023-04-26 DIAGNOSIS — I491 Atrial premature depolarization: Secondary | ICD-10-CM | POA: Diagnosis not present

## 2023-04-26 DIAGNOSIS — I4719 Other supraventricular tachycardia: Secondary | ICD-10-CM | POA: Diagnosis not present

## 2023-05-02 DIAGNOSIS — I2102 ST elevation (STEMI) myocardial infarction involving left anterior descending coronary artery: Secondary | ICD-10-CM | POA: Diagnosis not present

## 2023-05-02 DIAGNOSIS — R0989 Other specified symptoms and signs involving the circulatory and respiratory systems: Secondary | ICD-10-CM | POA: Diagnosis not present

## 2023-05-02 DIAGNOSIS — I25110 Atherosclerotic heart disease of native coronary artery with unstable angina pectoris: Secondary | ICD-10-CM | POA: Diagnosis not present

## 2023-05-02 DIAGNOSIS — I471 Supraventricular tachycardia, unspecified: Secondary | ICD-10-CM | POA: Diagnosis not present

## 2023-05-02 DIAGNOSIS — I10 Essential (primary) hypertension: Secondary | ICD-10-CM | POA: Diagnosis not present

## 2023-05-02 DIAGNOSIS — E782 Mixed hyperlipidemia: Secondary | ICD-10-CM | POA: Diagnosis not present

## 2023-05-02 DIAGNOSIS — Z955 Presence of coronary angioplasty implant and graft: Secondary | ICD-10-CM | POA: Diagnosis not present

## 2023-05-13 DIAGNOSIS — Z955 Presence of coronary angioplasty implant and graft: Secondary | ICD-10-CM | POA: Diagnosis not present

## 2023-05-13 DIAGNOSIS — I252 Old myocardial infarction: Secondary | ICD-10-CM | POA: Diagnosis not present

## 2023-05-13 DIAGNOSIS — I2510 Atherosclerotic heart disease of native coronary artery without angina pectoris: Secondary | ICD-10-CM | POA: Diagnosis not present

## 2023-05-18 ENCOUNTER — Ambulatory Visit (INDEPENDENT_AMBULATORY_CARE_PROVIDER_SITE_OTHER): Payer: Medicare Other

## 2023-05-18 ENCOUNTER — Encounter: Payer: Self-pay | Admitting: Family Medicine

## 2023-05-18 VITALS — BP 120/78 | HR 66 | Resp 16 | Ht 70.75 in | Wt 153.2 lb

## 2023-05-18 DIAGNOSIS — Z87891 Personal history of nicotine dependence: Secondary | ICD-10-CM | POA: Diagnosis not present

## 2023-05-18 DIAGNOSIS — Z0000 Encounter for general adult medical examination without abnormal findings: Secondary | ICD-10-CM | POA: Diagnosis not present

## 2023-05-18 DIAGNOSIS — G43011 Migraine without aura, intractable, with status migrainosus: Secondary | ICD-10-CM

## 2023-05-18 MED ORDER — MECLIZINE HCL 25 MG PO TABS
25.0000 mg | ORAL_TABLET | Freq: Three times a day (TID) | ORAL | 0 refills | Status: AC | PRN
Start: 2023-05-18 — End: ?

## 2023-05-18 NOTE — Progress Notes (Signed)
Subjective:   Kurt Wagner is a 62 y.o. male who presents for Medicare Annual/Subsequent preventive examination.  Visit Complete: In person This wellness visit is conducted by a nurse.  The patient's medications were reviewed and reconciled since the patient's last visit.  History details were provided by the patient.  The history appears to be reliable.    Medical History: Patient history and Family history was reviewed  Medications, Allergies, and preventative health maintenance was reviewed and updated.  Cardiac Risk Factors include: hypertension;smoking/ tobacco exposure;male gender;advanced age (>73men, >54 women)     Objective:    Today's Vitals   05/18/23 0911  BP: 120/78  Pulse: 66  Resp: 16  Weight: 153 lb 3.2 oz (69.5 kg)  Height: 5' 10.75" (1.797 m)  PainSc: 0-No pain   Body mass index is 21.52 kg/m.     08/08/2022    1:11 PM  Advanced Directives  Does Patient Have a Medical Advance Directive? No  Would patient like information on creating a medical advance directive? No - Patient declined    Current Medications (verified) Outpatient Encounter Medications as of 05/18/2023  Medication Sig   amLODipine (NORVASC) 2.5 MG tablet Take by mouth.   atorvastatin (LIPITOR) 80 MG tablet Take 80 mg by mouth daily.   clopidogrel (PLAVIX) 75 MG tablet Take by mouth.   diltiazem (CARDIZEM CD) 120 MG 24 hr capsule Take 1 capsule by mouth daily.   ezetimibe (ZETIA) 10 MG tablet Take 1 tablet (10 mg total) by mouth daily.   meclizine (ANTIVERT) 25 MG tablet Take 1 tablet (25 mg total) by mouth 3 (three) times daily as needed for dizziness.   nitroGLYCERIN (NITROSTAT) 0.4 MG SL tablet Place 1 tablet (0.4 mg total) under the tongue every 5 (five) minutes as needed for chest pain.   [DISCONTINUED] buPROPion (WELLBUTRIN) 100 MG tablet Take 1 tablet (100 mg total) by mouth 3 (three) times daily. (Patient not taking: Reported on 05/18/2023)   [DISCONTINUED] lisinopril (ZESTRIL)  20 MG tablet TAKE 1/2 TABLET (10 MG) BY MOUTH ONCE DAILY. (Patient not taking: Reported on 05/18/2023)   No facility-administered encounter medications on file as of 05/18/2023.    Allergies (verified) Azithromycin, Fentanyl, Simvastatin, Tape, Varenicline, and Wound dressing adhesive   History: Past Medical History:  Diagnosis Date   Ectopic atrial rhythm 12/26/2019   Essential (primary) hypertension    Essential hypertension 12/26/2019   Gastro-esophageal reflux disease with esophagitis    Intervertebral disc disorders with myelopathy of lumbar region    Lipoma of back 06/24/2016   Mixed hyperlipidemia    Nicotine dependence unspecified, with withdrawal    Precordial pain 12/26/2019   Status post coronary artery stent placement 05/12/2022   STEMI involving left anterior descending coronary artery (HCC) 05/12/2022   Formatting of this note might be different from the original.  DES to LAD and POBA to diagonal by Dr. Reuel Boom.   Tobacco use 12/26/2019   Past Surgical History:  Procedure Laterality Date   APPENDECTOMY     CHEST SURGERY     Tumor inside chest   CORONARY STENT PLACEMENT  05/12/2022   Xience Skypoint 3.0 mm x 33 mmCSN: [id number] [id number] REF: [id number], LOT [id number]   HERNIA REPAIR     LUMBAR DISC SURGERY     X3   TUMOR EXCISION     From back   Family History  Problem Relation Age of Onset   Hypertension Mother    Arthritis Mother    Diabetes Mother  Atrial fibrillation Father    Social History   Socioeconomic History   Marital status: Married    Spouse name: Not on file   Number of children: 2   Years of education: Not on file   Highest education level: Not on file  Occupational History   Not on file  Tobacco Use   Smoking status: Every Day    Packs/day: 0.50    Years: 49.00    Additional pack years: 0.00    Total pack years: 24.50    Types: Cigarettes   Smokeless tobacco: Former  Building services engineer Use: Former   Substance and Sexual Activity   Alcohol use: Not Currently    Alcohol/week: 12.0 standard drinks of alcohol    Types: 12 Cans of beer per week   Drug use: Never   Sexual activity: Yes    Partners: Female  Other Topics Concern   Not on file  Social History Narrative   Not on file   Social Determinants of Health   Financial Resource Strain: Low Risk  (05/18/2023)   Overall Financial Resource Strain (CARDIA)    Difficulty of Paying Living Expenses: Not hard at all  Food Insecurity: No Food Insecurity (05/18/2023)   Hunger Vital Sign    Worried About Running Out of Food in the Last Year: Never true    Ran Out of Food in the Last Year: Never true  Transportation Needs: No Transportation Needs (05/18/2023)   PRAPARE - Administrator, Civil Service (Medical): No    Lack of Transportation (Non-Medical): No  Physical Activity: Insufficiently Active (05/18/2023)   Exercise Vital Sign    Days of Exercise per Week: 3 days    Minutes of Exercise per Session: 30 min  Stress: No Stress Concern Present (05/18/2023)   [employer] of Occupational Health - Occupational Stress Questionnaire    Feeling of Stress : Not at all  Social Connections: Not on file    Tobacco Counseling Ready to quit: No Counseling given: Yes   Clinical Intake:  Pre-visit preparation completed: Yes Pain : No/denies pain Pain Score: 0-No pain   BMI - recorded: 21.52 Nutritional Status: BMI of 19-24  Normal Nutritional Risks: None Diabetes: No How often do you need to have someone help you when you read instructions, pamphlets, or other written materials from your doctor or pharmacy?: 2 - Rarely Interpreter Needed?: No      Activities of Daily Living    05/18/2023    9:06 AM  In your present state of health, do you have any difficulty performing the following activities:  Hearing? 1  Vision? 0  Difficulty concentrating or making decisions? 0  Walking or climbing stairs? 0  Dressing or  bathing? 0  Doing errands, shopping? 0  Preparing Food and eating ? N  Using the Toilet? N  In the past six months, have you accidently leaked urine? N  Do you have problems with loss of bowel control? N  Managing your Medications? N  Managing your Finances? N  Housekeeping or managing your Housekeeping? N    Patient Care Team: Blane Ohara, MD as PCP - General (Family Medicine) Thomasene Ripple, DO as PCP - Cardiology (Cardiology) Ocie Doyne, MD (Neurology)     Assessment:   This is a routine wellness examination for Kurt Wagner.  Hearing/Vision screen No results found.  Depression Screen    05/18/2023    9:04 AM 05/24/2022    2:27 PM 01/07/2021  7:39 AM  PHQ 2/9 Scores  PHQ - 2 Score 0 0 0    Fall Risk    05/18/2023    9:05 AM 05/24/2022    2:26 PM  Fall Risk   Falls in the past year? 0 1  Number falls in past yr: 0 0  Injury with Fall? 0 0  Risk for fall due to : No Fall Risks No Fall Risks  Follow up Falls evaluation completed;Education provided;Falls prevention discussed Falls evaluation completed    MEDICARE RISK AT HOME:  Medicare Risk at Home - 05/18/23 0906     Any stairs in or around the home? No    If so, are there any without handrails? No    Home free of loose throw rugs in walkways, pet beds, electrical cords, etc? Yes    Adequate lighting in your home to reduce risk of falls? Yes    Life alert? No    Use of a cane, walker or w/c? No    Grab bars in the bathroom? No    Shower chair or bench in shower? No    Elevated toilet seat or a handicapped toilet? No             TIMED UP AND GO:  Was the test performed?  Yes  Length of time to ambulate 10 feet: 6 sec Gait slow and steady without use of assistive device    Cognitive Function:        Immunizations  There is no immunization history on file for this patient.  TDAP status: Due, Education has been provided regarding the importance of this vaccine. Advised may receive this vaccine at  local pharmacy or Health Dept. Aware to provide a copy of the vaccination record if obtained from local pharmacy or Health Dept. Verbalized acceptance and understanding.  Flu Vaccine status: Declined, Education has been provided regarding the importance of this vaccine but patient still declined. Advised may receive this vaccine at local pharmacy or Health Dept. Aware to provide a copy of the vaccination record if obtained from local pharmacy or Health Dept. Verbalized acceptance and understanding.  Pneumococcal vaccine status: Up to date  Covid-19 vaccine status: Declined, Education has been provided regarding the importance of this vaccine but patient still declined. Advised may receive this vaccine at local pharmacy or Health Dept.or vaccine clinic. Aware to provide a copy of the vaccination record if obtained from local pharmacy or Health Dept. Verbalized acceptance and understanding.  Qualifies for Shingles Vaccine? Yes   Zostavax completed No   Shingrix Completed?: No.    Education has been provided regarding the importance of this vaccine. Patient has been advised to call insurance company to determine out of pocket expense if they have not yet received this vaccine. Advised may also receive vaccine at local pharmacy or Health Dept. Verbalized acceptance and understanding.  Screening Tests Health Maintenance  Topic Date Due   Lung Cancer Screening  Never done   Medicare Annual Wellness (AWV)  10/18/2018   COVID-19 Vaccine (1) 10/15/2023 (Originally 07/23/1966)   INFLUENZA VACCINE  06/30/2023   Colonoscopy  04/15/2027   HPV VACCINES  Aged Out   DTaP/Tdap/Td  Discontinued   Zoster Vaccines- Shingrix  Discontinued    Health Maintenance  Health Maintenance Due  Topic Date Due   Lung Cancer Screening  Never done   Medicare Annual Wellness (AWV)  10/18/2018    Colorectal cancer screening: Type of screening: Colonoscopy. Completed 04/14/2017.  Lung Cancer Screening: (Low  Dose CT  Chest recommended if Age 35-80 years, 20 pack-year currently smoking OR have quit w/in 15years.) does qualify.   Lung Cancer Screening Referral: Referred to [facility]  Additional Screening:  Vision Screening: Recommended annual ophthalmology exams for early detection of glaucoma and other disorders of the eye. Is the patient up to date with their annual eye exam?  Yes  Who is the provider or what is the name of the office in which the patient attends annual eye exams? Dr Dan Humphreys  Dental Screening: Recommended annual dental exams for proper oral hygiene  Community Resource Referral / Chronic Care Management: CRR required this visit?  No   CCM required this visit?  No     Plan:    1- Patient declined all vaccines (COVID, Flu, Tetanus, Shingrix) 2- CT Lung screening ordered 3- Counseling was provided today regarding the following topics: healthy eating habits, home safety, regular exercise, breast self-exam, tobacco avoidance, limitation of alcohol intake, use of seat belts, firearm safety, and fall prevention.  Annual recommendations include: influenza vaccine, dental cleanings, and eye exams.  I have personally reviewed and noted the following in the patient's chart:   Medical and social history Use of alcohol, tobacco or illicit drugs  Current medications and supplements including opioid prescriptions. Patient is not currently taking opioid prescriptions. Functional ability and status Nutritional status Physical activity Advanced directives List of other physicians Hospitalizations, surgeries, and ER visits in previous 12 months Vitals Screenings to include cognitive, depression, and falls Referrals and appointments  In addition, I have reviewed and discussed with patient certain preventive protocols, quality metrics, and best practice recommendations. A written personalized care plan for preventive services as well as general preventive health recommendations were  provided to patient.     Jacklynn Bue, LPN   [id number]   After Visit Summary: AWV printed for patient

## 2023-05-18 NOTE — Patient Instructions (Signed)
Kurt Wagner , Thank you for taking time to come for your Medicare Wellness Visit. I appreciate your ongoing commitment to your health goals. Please review the following plan we discussed and let me know if I can assist you in the future.     This is a list of the screening recommended for you and due dates:  Health Maintenance  Topic Date Due   Screening for Lung Cancer  Never done - ordered   COVID-19 Vaccine (1) 10/15/2023* - declined   Flu Shot  06/30/2023 - declined   Medicare Annual Wellness Visit  05/17/2024   Colon Cancer Screening  04/15/2027   HPV Vaccine  Aged Out   DTaP/Tdap/Td vaccine  Discontinued - declined   Zoster (Shingles) Vaccine  Discontinued - declined  *Topic was postponed. The date shown is not the original due date.    Preventive Care 40-64 Years, Male Preventive care refers to lifestyle choices and visits with your health care provider that can promote health and wellness. What does preventive care include? A yearly physical exam. This is also called an annual well check. Dental exams once or twice a year. Routine eye exams. Ask your health care provider how often you should have your eyes checked. Personal lifestyle choices, including: Daily care of your teeth and gums. Regular physical activity. Eating a healthy diet. Avoiding tobacco and drug use. Limiting alcohol use. Practicing safe sex. Taking low-dose aspirin every day starting at age 85. What happens during an annual well check? The services and screenings done by your health care provider during your annual well check will depend on your age, overall health, lifestyle risk factors, and family history of disease. Counseling  Your health care provider may ask you questions about your: Alcohol use. Tobacco use. Drug use. Emotional well-being. Home and relationship well-being. Sexual activity. Eating habits. Work and work Astronomer. Screening  You may have the following tests or  measurements: Height, weight, and BMI. Blood pressure. Lipid and cholesterol levels. These may be checked every 5 years, or more frequently if you are over 22 years old. Skin check. Lung cancer screening. You may have this screening every year starting at age 24 if you have a 30-pack-year history of smoking and currently smoke or have quit within the past 15 years. Fecal occult blood test (FOBT) of the stool. You may have this test every year starting at age 87. Flexible sigmoidoscopy or colonoscopy. You may have a sigmoidoscopy every 5 years or a colonoscopy every 10 years starting at age 61. Prostate cancer screening. Recommendations will vary depending on your family history and other risks. Hepatitis C blood test. Hepatitis B blood test. Sexually transmitted disease (STD) testing. Diabetes screening. This is done by checking your blood sugar (glucose) after you have not eaten for a while (fasting). You may have this done every 1-3 years. Discuss your test results, treatment options, and if necessary, the need for more tests with your health care provider. Vaccines  Your health care provider may recommend certain vaccines, such as: Influenza vaccine. This is recommended every year. Tetanus, diphtheria, and acellular pertussis (Tdap, Td) vaccine. You may need a Td booster every 10 years. Zoster vaccine. You may need this after age 73. Pneumococcal 13-valent conjugate (PCV13) vaccine. You may need this if you have certain conditions and have not been vaccinated. Pneumococcal polysaccharide (PPSV23) vaccine. You may need one or two doses if you smoke cigarettes or if you have certain conditions. Talk to your health care provider about  which screenings and vaccines you need and how often you need them. This information is not intended to replace advice given to you by your health care provider. Make sure you discuss any questions you have with your health care provider. Document Released:  12/12/2015 Document Revised: 08/04/2016 Document Reviewed: 09/16/2015 Elsevier Interactive Patient Education  2017 [employer].  Fall Prevention in the Home Falls can cause injuries. They can happen to people of all ages. There are many things you can do to make your home safe and to help prevent falls. What can I do on the outside of my home? Regularly fix the edges of walkways and driveways and fix any cracks. Remove anything that might make you trip as you walk through a door, such as a raised step or threshold. Trim any bushes or trees on the path to your home. Use bright outdoor lighting. Clear any walking paths of anything that might make someone trip, such as rocks or tools. Regularly check to see if handrails are loose or broken. Make sure that both sides of any steps have handrails. Any raised decks and porches should have guardrails on the edges. Have any leaves, snow, or ice cleared regularly. Use sand or salt on walking paths during winter. Clean up any spills in your garage right away. This includes oil or grease spills. What can I do in the bathroom? Use night lights. Install grab bars by the toilet and in the tub and shower. Do not use towel bars as grab bars. Use non-skid mats or decals in the tub or shower. If you need to sit down in the shower, use a plastic, non-slip stool. Keep the floor dry. Clean up any water that spills on the floor as soon as it happens. Remove soap buildup in the tub or shower regularly. Attach bath mats securely with double-sided non-slip rug tape. Do not have throw rugs and other things on the floor that can make you trip. What can I do in the bedroom? Use night lights. Make sure that you have a light by your bed that is easy to reach. Do not use any sheets or blankets that are too big for your bed. They should not hang down onto the floor. Have a firm chair that has side arms. You can use this for support while you get dressed. Do not have  throw rugs and other things on the floor that can make you trip. What can I do in the kitchen? Clean up any spills right away. Avoid walking on wet floors. Keep items that you use a lot in easy-to-reach places. If you need to reach something above you, use a strong step stool that has a grab bar. Keep electrical cords out of the way. Do not use floor polish or wax that makes floors slippery. If you must use wax, use non-skid floor wax. Do not have throw rugs and other things on the floor that can make you trip. What can I do with my stairs? Do not leave any items on the stairs. Make sure that there are handrails on both sides of the stairs and use them. Fix handrails that are broken or loose. Make sure that handrails are as long as the stairways. Check any carpeting to make sure that it is firmly attached to the stairs. Fix any carpet that is loose or worn. Avoid having throw rugs at the top or bottom of the stairs. If you do have throw rugs, attach them to the floor with  carpet tape. Make sure that you have a light switch at the top of the stairs and the bottom of the stairs. If you do not have them, ask someone to add them for you. What else can I do to help prevent falls? Wear shoes that: Do not have high heels. Have rubber bottoms. Are comfortable and fit you well. Are closed at the toe. Do not wear sandals. If you use a stepladder: Make sure that it is fully opened. Do not climb a closed stepladder. Make sure that both sides of the stepladder are locked into place. Ask someone to hold it for you, if possible. Clearly mark and make sure that you can see: Any grab bars or handrails. First and last steps. Where the edge of each step is. Use tools that help you move around (mobility aids) if they are needed. These include: Canes. Walkers. Scooters. Crutches. Turn on the lights when you go into a dark area. Replace any light bulbs as soon as they burn out. Set up your furniture so  you have a clear path. Avoid moving your furniture around. If any of your floors are uneven, fix them. If there are any pets around you, be aware of where they are. Review your medicines with your doctor. Some medicines can make you feel dizzy. This can increase your chance of falling. Ask your doctor what other things that you can do to help prevent falls. This information is not intended to replace advice given to you by your health care provider. Make sure you discuss any questions you have with your health care provider. Document Released: 09/11/2009 Document Revised: 04/22/2016 Document Reviewed: 12/20/2014 Elsevier Interactive Patient Education  2017 [employer].

## 2023-05-26 DIAGNOSIS — Z122 Encounter for screening for malignant neoplasm of respiratory organs: Secondary | ICD-10-CM | POA: Diagnosis not present

## 2023-05-26 DIAGNOSIS — F17210 Nicotine dependence, cigarettes, uncomplicated: Secondary | ICD-10-CM | POA: Diagnosis not present

## 2023-05-26 DIAGNOSIS — Z87891 Personal history of nicotine dependence: Secondary | ICD-10-CM | POA: Diagnosis not present

## 2023-05-31 ENCOUNTER — Encounter: Payer: Self-pay | Admitting: Family Medicine

## 2023-06-03 DIAGNOSIS — I252 Old myocardial infarction: Secondary | ICD-10-CM | POA: Diagnosis not present

## 2023-06-03 DIAGNOSIS — R0989 Other specified symptoms and signs involving the circulatory and respiratory systems: Secondary | ICD-10-CM | POA: Diagnosis not present

## 2023-06-03 DIAGNOSIS — I10 Essential (primary) hypertension: Secondary | ICD-10-CM | POA: Diagnosis not present

## 2023-06-03 DIAGNOSIS — Z955 Presence of coronary angioplasty implant and graft: Secondary | ICD-10-CM | POA: Diagnosis not present

## 2023-06-03 DIAGNOSIS — E782 Mixed hyperlipidemia: Secondary | ICD-10-CM | POA: Diagnosis not present

## 2023-06-03 DIAGNOSIS — I471 Supraventricular tachycardia, unspecified: Secondary | ICD-10-CM | POA: Diagnosis not present

## 2023-06-03 DIAGNOSIS — I25110 Atherosclerotic heart disease of native coronary artery with unstable angina pectoris: Secondary | ICD-10-CM | POA: Diagnosis not present

## 2023-07-12 ENCOUNTER — Encounter: Payer: Self-pay | Admitting: Psychiatry

## 2023-07-12 ENCOUNTER — Ambulatory Visit (INDEPENDENT_AMBULATORY_CARE_PROVIDER_SITE_OTHER): Payer: Medicare Other | Admitting: Psychiatry

## 2023-07-12 VITALS — BP 130/88 | HR 93 | Ht 70.0 in | Wt 152.0 lb

## 2023-07-12 DIAGNOSIS — G44019 Episodic cluster headache, not intractable: Secondary | ICD-10-CM

## 2023-07-12 MED ORDER — EMGALITY (300 MG DOSE) 100 MG/ML ~~LOC~~ SOSY: 300.0000 mg | PREFILLED_SYRINGE | SUBCUTANEOUS | 6 refills | Status: DC

## 2023-07-12 MED ORDER — EMGALITY 120 MG/ML ~~LOC~~ SOAJ: 3.0000 | Freq: Once | SUBCUTANEOUS | Status: AC

## 2023-07-12 NOTE — Patient Instructions (Signed)
Start Emgality monthly for cluster headaches. Do 3 of the shots from the Emgality samples. You can discard the 4th injection

## 2023-07-12 NOTE — Progress Notes (Signed)
   CC:  headaches  Follow-up Visit  Last visit: 11/02/22  Brief HPI: 62 year old male with a history of CAD, STEMI s/p LAD stent, HTN, smoking, cervical radiculopathy who follows in clinic for cluster headaches.  Interval History: He has started to have right-sided headaches in the past 4 days. They are associated with nasal congestion and right ptosis. They have been occurring between 4-5 pm every day. They last ~2-3 hours at a time. He is taking Advil migraine which has only been somewhat effective.    Headache days per month: 4 Headache free days per month: 26  Current Headache Regimen: Preventative: none Abortive: Advil   Prior Therapies                                  Prevention: Metoprolol 25 mg BID Carvedilol 6.25 mg BID Amlodipine 2.5 mg  Diltiazem 120 mg daily Lisinopril 20 mg daily Cymbalta 60 mg daily Nortriptyline 150 mg QHS Topamax 25 mg BID Prednisone - worsened headache   Rescue: ibuprofen Meloxicam Maxalt 10 mg PRN Cannot take triptans due to CAD  Physical Exam:   Vital Signs: BP 130/88 (BP Location: Left Arm, Patient Position: Sitting, Cuff Size: Normal)   Pulse 93   Ht 5\' 10"  (1.778 m)   Wt 152 lb (68.9 kg)   BMI 21.81 kg/m  GENERAL:  well appearing, in no acute distress, alert  SKIN:  Color, texture, turgor normal. No rashes or lesions HEAD:  Normocephalic/atraumatic. RESP: normal respiratory effort MSK:  No gross joint deformities.   NEUROLOGICAL: Mental Status: Alert, oriented to person, place and time, Follows commands, and Speech fluent and appropriate. Cranial Nerves: PERRL, face symmetric, no dysarthria, hearing grossly intact Motor: moves all extremities equally Gait: normal-based.  IMPRESSION: 62 year old male with a history of CAD, STEMI s/p LAD stent, HTN, smoking, cervical radiculopathy who presents for follow up of cluster headaches. He has begun to enter a new cluster headache cycle starting 4 days ago. Previously tried  prednisone which made his headaches worse. Cannot take verapamil as he is already taking calcium channel blockers for PSVT and HTN. Will start Emgality for cluster headaches. Sample provided in the office today. Cannot take triptans due to CAD. Will work on getting home oxygen set up to take as needed for cluster headaches.  PLAN: -Prevention: Start Emgality 300 mg per month. Take monthly until cluster cycle ends -Rescue: Oxygen PRN   Follow-up: 5 months, or sooner if needed  I spent a total of 19 minutes on the date of the service. Headache education was done. Discussed treatment options including preventive and acute medications. Discussed medication side effects, adverse reactions and drug interactions. Written educational materials and patient instructions outlining all of the above were given.  Ocie Doyne, MD 07/12/23 1:30 PM

## 2023-07-13 ENCOUNTER — Other Ambulatory Visit: Payer: Self-pay | Admitting: Psychiatry

## 2023-07-13 DIAGNOSIS — G44011 Episodic cluster headache, intractable: Secondary | ICD-10-CM

## 2023-07-14 ENCOUNTER — Telehealth: Payer: Self-pay | Admitting: Psychiatry

## 2023-07-14 NOTE — Telephone Encounter (Signed)
Pt's wife states they picked up the oxygen but the liters were not indicated as far as how much should be used, also there is no mask that came along with the oxygen, wife is asking for a call to discuss.

## 2023-07-14 NOTE — Telephone Encounter (Signed)
Discussed with patient and advised to start at 2L prn and move titrate no more 4L PRN if and as needed. Patient verbalized understanding and that his wife was aware of how to adjust liter settings. DME company did leave nasal cannula and patient states he will reach out to them for a mask. No other questions or concerns at this time.

## 2023-07-14 NOTE — Telephone Encounter (Signed)
Let's start with O2 2 to 4 L/min as needed. Dr. Delena Bali: please clarify if additional instructions are necessary.

## 2023-07-21 ENCOUNTER — Telehealth: Payer: Self-pay | Admitting: Psychiatry

## 2023-07-21 NOTE — Telephone Encounter (Signed)
Kurt Wagner LVM at 1:23p. He saw Dr. Delena Bali recently and she prescribed him Galcanezumab-gnlm (EMGALITY, 300 MG DOSE,) 100 MG/ML SOSY for cluster headaches. She gave [place] samples to get [place] started. He checked with pharmacy on getting it and it's like $1900. Looked into the [employer] card, but because he has Medicare he not eligible Emgality card savings. Did not know if something else that Dr. Delena Bali can try. What do we need to do? Medication does seem to be helping. The headaches are not as severe as they were last year. When he gets them he gets on the oxygen does help take away the headache away. But he did have the Emgality samples. I do not know how much of it is the Emgality. What to see what our option are. Can call Joe's phone [phone]

## 2023-07-25 ENCOUNTER — Other Ambulatory Visit (HOSPITAL_COMMUNITY): Payer: Self-pay

## 2023-07-25 NOTE — Telephone Encounter (Signed)
Can you have the PT upload his pharmacy insurance card? I have tried to run an eligibility check and nothing is pulling up-I see a traditional red white and blue medicare card scanned into Epic but no pharmacy benefits.I will need this information in order to do a test claim and start a PA.

## 2023-07-27 MED ORDER — TOPIRAMATE 50 MG PO TABS: ORAL_TABLET | ORAL | 6 refills | Status: DC

## 2023-07-27 NOTE — Telephone Encounter (Signed)
I sent an rx for Topamax to his pharmacy. He took this before but it was a low dose. We'll try a higher dose and see if it is more effective. It should be more affordable than the [employer]

## 2023-07-27 NOTE — Telephone Encounter (Signed)
Spoke with patient wife, informed her with new Rx sent to pharmacy. Wife said she will relay to husband.

## 2023-08-26 ENCOUNTER — Encounter: Payer: Self-pay | Admitting: Cardiology

## 2023-08-26 ENCOUNTER — Ambulatory Visit: Payer: Medicare Other | Attending: Cardiology | Admitting: Cardiology

## 2023-08-26 ENCOUNTER — Other Ambulatory Visit (INDEPENDENT_AMBULATORY_CARE_PROVIDER_SITE_OTHER): Payer: Medicare Other

## 2023-08-26 VITALS — BP 134/90 | HR 75 | Ht 70.0 in | Wt 154.0 lb

## 2023-08-26 DIAGNOSIS — I493 Ventricular premature depolarization: Secondary | ICD-10-CM

## 2023-08-26 DIAGNOSIS — I2510 Atherosclerotic heart disease of native coronary artery without angina pectoris: Secondary | ICD-10-CM

## 2023-08-26 DIAGNOSIS — I10 Essential (primary) hypertension: Secondary | ICD-10-CM | POA: Diagnosis not present

## 2023-08-26 DIAGNOSIS — R42 Dizziness and giddiness: Secondary | ICD-10-CM

## 2023-08-26 NOTE — Progress Notes (Unsigned)
Enrolled patient for a 14 day Zio AT monitor to be mailed to patients home.  

## 2023-08-26 NOTE — Patient Instructions (Addendum)
Medication Instructions:  Your physician recommends that you continue on your current medications as directed. Please refer to the Current Medication list given to you today.  *If you need a refill on your cardiac medications before your next appointment, please call your pharmacy*   Lab Work: None   Testing/Procedures: ZIO AT Long term monitor-Live Telemetry  Your physician has requested you wear a ZIO patch monitor for 14 days.  This is a single patch monitor. [employer] supplies one patch monitor per enrollment. Additional  stickers are not available.  Please do not apply patch if you will be having a Nuclear Stress Test, Echocardiogram, Cardiac CT, MRI,  or Chest Xray during the period you would be wearing the monitor. The patch cannot be worn during  these tests. You cannot remove and re-apply the ZIO AT patch monitor.  Your ZIO patch monitor will be mailed 3 day USPS to your address on file. It may take 3-5 days to  receive your monitor after you have been enrolled.  Once you have received your monitor, please review the enclosed instructions. Your monitor has  already been registered assigning a specific monitor serial # to you.   Billing and Patient Assistance Program information  Kurt Wagner has been supplied with any insurance information on record for billing. [employer] offers a sliding scale Patient Assistance Program for patients without insurance, or whose  insurance does not completely cover the cost of the ZIO patch monitor. You must apply for the  Patient Assistance Program to qualify for the discounted rate. To apply, call [employer] at [phone],  select option 4, select option 2 , ask to apply for the Patient Assistance Program, (you can request an  interpreter if needed). [employer] will ask your household income and how many people are in your  household. [employer] will quote your out-of-pocket cost based on this information. They will also be able  to set up a 12 month  interest free payment plan if needed.  Applying the monitor   Shave hair from upper left chest.  Hold the abrader disc by orange tab. Rub the abrader in 40 strokes over left upper chest as indicated in  your monitor instructions.  Clean area with 4 enclosed alcohol pads. Use all pads to ensure the area is cleaned thoroughly. Let  dry.  Apply patch as indicated in monitor instructions. Patch will be placed under collarbone on left side of  chest with arrow pointing upward.  Rub patch adhesive wings for 2 minutes. Remove the white label marked "1". Remove the white label  marked "2". Rub patch adhesive wings for 2 additional minutes.  While looking in a mirror, press and release button in center of patch. A small green light will flash 3-4  times. This will be your only indicator that the monitor has been turned on.  Do not shower for the first 24 hours. You may shower after the first 24 hours.  Press the button if you feel a symptom. You will hear a small click. Record Date, Time and Symptom in  the Patient Log.   Starting the Gateway  In your kit there is a Audiological scientist box the size of a cellphone. This is Buyer, retail. It transmits all your  recorded data to [facility]. This box must always stay within 10 feet of you. Open the box and push the *  button. There will be a light that blinks orange and then green a few times. When the light stops  blinking, the  Gateway is connected to the ZIO patch. Call [employer] at [phone] to confirm your monitor is transmitting.  Returning your monitor  Remove your patch and place it inside the Gateway. In the lower half of the Gateway there is a white  bag with prepaid postage on it. Place Gateway in bag and seal. Mail package back to [place] as soon as  possible. Your physician should have your final report approximately 7 days after you have mailed back  your monitor. Call [facility] Customer Care at [phone] if you have questions  regarding your ZIO AT  patch monitor. Call them immediately if you see an orange light blinking on your monitor.  If your monitor falls off in less than 4 days, contact our Monitor department at [phone]. If your  monitor becomes loose or falls off after 4 days call [employer] at [phone] for suggestions on  securing your monitor   Follow-Up: At [facility], you and your health needs are our priority.  As part of our continuing mission to provide you with exceptional heart care, we have created designated Provider Care Teams.  These Care Teams include your primary Cardiologist (physician) and Advanced Practice Providers (APPs -  Physician Assistants and Nurse Practitioners) who all work together to provide you with the care you need, when you need it.  We recommend signing up for the patient portal called "MyChart".  Sign up information is provided on this After Visit Summary.  MyChart is used to connect with patients for Virtual Visits (Telemedicine).  Patients are able to view lab/test results, encounter notes, upcoming appointments, etc.  Non-urgent messages can be sent to your provider as well.   To learn more about what you can do with MyChart, go to [email or web address].    Your next appointment:   12 week(s)- overbook ok  Provider:   Thomasene Ripple, DO     Other instructions: Kurt Wagner Address: [phone] Phone: [phone]

## 2023-08-30 DIAGNOSIS — I493 Ventricular premature depolarization: Secondary | ICD-10-CM

## 2023-08-31 DIAGNOSIS — I493 Ventricular premature depolarization: Secondary | ICD-10-CM | POA: Diagnosis not present

## 2023-09-06 ENCOUNTER — Encounter: Payer: Self-pay | Admitting: Family Medicine

## 2023-09-06 ENCOUNTER — Ambulatory Visit (INDEPENDENT_AMBULATORY_CARE_PROVIDER_SITE_OTHER): Payer: Medicare Other | Admitting: Family Medicine

## 2023-09-06 VITALS — BP 110/80 | HR 80 | Temp 98.0°F | Resp 16 | Ht 71.0 in | Wt 156.6 lb

## 2023-09-06 DIAGNOSIS — I471 Supraventricular tachycardia, unspecified: Secondary | ICD-10-CM

## 2023-09-06 DIAGNOSIS — G44019 Episodic cluster headache, not intractable: Secondary | ICD-10-CM | POA: Insufficient documentation

## 2023-09-06 NOTE — Assessment & Plan Note (Signed)
Patient currently wearing a Zios monitor again for irregular heart rate. Last seen by cardiologist Thomasene Ripple, DO 08/26/23 Continue present management of medications

## 2023-09-06 NOTE — Progress Notes (Signed)
Acute Office Visit  Subjective:    Patient ID: Kurt Wagner, male    DOB: 05-20-1961, 62 y.o.   MRN: [id number]  Chief Complaint  Patient presents with   Dizziness   Headache    HPI: Patient is in today with his wife for headaches and dizziness.  He has been under the care of Dr. Delena Bali in [place].  He also is currently wearing a zios monitor for irregular heart rate.  Patient first had a cluster headache in the 90's and last year had starting having them again. They have progressively gotten worse since August of this year. Currently he has cluster headaches daily in the mid to late afternoon. He recently started using O2 to relieve his headaches and it seems to be working along with the other medications that Dr. Delena Bali has started him on. He can not drive due the incapacitating nature of his headaches. His wife has to drive his to his appointment and help with cooking, cleaning, shopping, and dressing. She is requesting FMLA paperwork filled out due to having to miss work to take him to his appointments. She is also wanting information on how to get him a handicap placard for his car since he is now on oxygen.   Past Medical History:  Diagnosis Date   Ectopic atrial rhythm 12/26/2019   Essential (primary) hypertension    Essential hypertension 12/26/2019   Gastro-esophageal reflux disease with esophagitis    Intervertebral disc disorders with myelopathy of lumbar region    Lipoma of back 06/24/2016   Mixed hyperlipidemia    Nicotine dependence unspecified, with withdrawal    Precordial pain 12/26/2019   Status post coronary artery stent placement 05/12/2022   STEMI involving left anterior descending coronary artery (HCC) 05/12/2022   Formatting of this note might be different from the original.  DES to LAD and POBA to diagonal by Dr. Reuel Boom.   Tobacco use 12/26/2019    Past Surgical History:  Procedure Laterality Date   APPENDECTOMY     CHEST SURGERY     Tumor inside  chest   CORONARY STENT PLACEMENT  05/12/2022   Xience Skypoint 3.0 mm x 33 mmCSN: [id number] UDI-[id number] REF: [id number], LOT [id number]   HERNIA REPAIR     LUMBAR DISC SURGERY     X3   TUMOR EXCISION     From back    Family History  Problem Relation Age of Onset   Hypertension Mother    Arthritis Mother    Diabetes Mother    Atrial fibrillation Father     Social History   Socioeconomic History   Marital status: Married    Spouse name: Not on file   Number of children: 2   Years of education: Not on file   Highest education level: Not on file  Occupational History   Not on file  Tobacco Use   Smoking status: Every Day    Current packs/day: 0.50    Average packs/day: 0.5 packs/day for 49.0 years (24.5 ttl pk-yrs)    Types: Cigarettes   Smokeless tobacco: Former  Building services engineer status: Former  Substance and Sexual Activity   Alcohol use: Not Currently    Alcohol/week: 12.0 standard drinks of alcohol    Types: 12 Cans of beer per week   Drug use: Never   Sexual activity: Yes    Partners: Female  Other Topics Concern   Not on file  Social History Narrative   Not on file  Social Determinants of Health   Financial Resource Strain: Low Risk  (05/18/2023)   Overall Financial Resource Strain (CARDIA)    Difficulty of Paying Living Expenses: Not hard at all  Food Insecurity: No Food Insecurity (05/18/2023)   Hunger Vital Sign    Worried About Running Out of Food in the Last Year: Never true    Ran Out of Food in the Last Year: Never true  Transportation Needs: No Transportation Needs (05/18/2023)   PRAPARE - Administrator, Civil Service (Medical): No    Lack of Transportation (Non-Medical): No  Physical Activity: Insufficiently Active (05/18/2023)   Exercise Vital Sign    Days of Exercise per Week: 3 days    Minutes of Exercise per Session: 30 min  Stress: No Stress Concern Present (05/18/2023)   [employer] of Occupational Health  - Occupational Stress Questionnaire    Feeling of Stress : Not at all  Social Connections: Not on file  Intimate Partner Violence: Not At Risk (05/18/2023)   Humiliation, Afraid, Rape, and Kick questionnaire    Fear of Current or Ex-Partner: No    Emotionally Abused: No    Physically Abused: No    Sexually Abused: No    Outpatient Medications Prior to Visit  Medication Sig Dispense Refill   amLODipine (NORVASC) 2.5 MG tablet Take by mouth. (Patient not taking: Reported on 08/26/2023)     ASPIRIN 81 PO Take 81 mg by mouth daily.     atorvastatin (LIPITOR) 80 MG tablet Take 80 mg by mouth daily.     clopidogrel (PLAVIX) 75 MG tablet Take 1 tablet by mouth daily.     diltiazem (CARDIZEM CD) 180 MG 24 hr capsule Take 180 mg by mouth daily.     meclizine (ANTIVERT) 25 MG tablet Take 1 tablet (25 mg total) by mouth 3 (three) times daily as needed for dizziness. 30 tablet 0   nitroGLYCERIN (NITROSTAT) 0.4 MG SL tablet Place 1 tablet (0.4 mg total) under the tongue every 5 (five) minutes as needed for chest pain. 25 tablet 6   topiramate (TOPAMAX) 50 MG tablet Take 1 pill at bedtime for 1 week, then increase to 1 pill twice a day 60 tablet 6   diltiazem (CARDIZEM CD) 120 MG 24 hr capsule Take 1 capsule by mouth daily. (Patient not taking: Reported on 08/26/2023)     ezetimibe (ZETIA) 10 MG tablet Take 1 tablet (10 mg total) by mouth daily. 90 tablet 0   Galcanezumab-gnlm (EMGALITY, 300 MG DOSE,) 100 MG/ML SOSY Inject 300 mg into the skin every 30 (thirty) days. Inject monthly until cluster headache cycle ends (Patient not taking: Reported on 08/26/2023) 3.08 mL 6   No facility-administered medications prior to visit.    Allergies  Allergen Reactions   Azithromycin Other (See Comments)    unknown   Fentanyl Other (See Comments)    Headache   Simvastatin Other (See Comments)    Joint pain   Tape Other (See Comments)    Blisters   Varenicline Other (See Comments)    unknown   Wound Dressing  Adhesive Other (See Comments)    Blisters    Review of Systems  Constitutional:  Positive for fatigue. Negative for chills and fever.  HENT:  Negative for congestion, ear pain, rhinorrhea, sinus pain and sore throat.   Respiratory:  Negative for cough and shortness of breath.   Cardiovascular:  Positive for palpitations. Negative for chest pain.  Gastrointestinal:  Negative for abdominal pain,  constipation, diarrhea, nausea and vomiting.  Genitourinary:  Negative for dysuria and urgency.  Musculoskeletal:  Negative for arthralgias, back pain and myalgias.  Neurological:  Positive for dizziness and headaches.  Psychiatric/Behavioral:  Negative for dysphoric mood. The patient is not nervous/anxious.        Objective:        09/06/2023    7:52 AM 08/26/2023   10:53 AM 08/26/2023   10:52 AM  Vitals with BMI  Height 5\' 11"   5\' 10"   Weight 156 lbs 10 oz  154 lbs  BMI 21.85  22.1  Systolic 110 134 301  Diastolic 80 90 88  Pulse 80  75    No data found.   Physical Exam Constitutional:      General: He is not in acute distress.    Appearance: Normal appearance. He is not ill-appearing.  Neck:     Vascular: No carotid bruit.  Cardiovascular:     Rate and Rhythm: Normal rate and regular rhythm.     Heart sounds: Normal heart sounds.  Pulmonary:     Effort: Pulmonary effort is normal.     Breath sounds: Normal breath sounds.  Neurological:     Mental Status: He is alert. Mental status is at baseline.  Psychiatric:        Mood and Affect: Mood normal.        Behavior: Behavior normal.     Health Maintenance Due  Topic Date Due   Lung Cancer Screening  Never done   INFLUENZA VACCINE  06/30/2023    There are no preventive care reminders to display for this patient.   Lab Results  Component Value Date   TSH 1.030 07/28/2022   Lab Results  Component Value Date   WBC 7.4 03/09/2023   HGB 15.8 03/09/2023   HCT 48.0 03/09/2023   MCV 93 03/09/2023   PLT 173  03/09/2023   Lab Results  Component Value Date   NA 141 03/09/2023   K 5.0 03/09/2023   CO2 23 03/09/2023   GLUCOSE 86 03/09/2023   BUN 9 03/09/2023   CREATININE 0.99 03/09/2023   BILITOT 0.3 03/09/2023   ALKPHOS 99 03/09/2023   AST 14 03/09/2023   ALT 10 03/09/2023   PROT 6.8 03/09/2023   ALBUMIN 4.5 03/09/2023   CALCIUM 9.4 03/09/2023   ANIONGAP 8 08/08/2022   EGFR 87 03/09/2023   Lab Results  Component Value Date   CHOL 133 03/09/2023   Lab Results  Component Value Date   HDL 42 03/09/2023   Lab Results  Component Value Date   LDLCALC 76 03/09/2023   Lab Results  Component Value Date   TRIG 74 03/09/2023   Lab Results  Component Value Date   CHOLHDL 3.2 03/09/2023   Lab Results  Component Value Date   HGBA1C 5.8 (H) 03/09/2023       Assessment & Plan:  Episodic cluster headache, not intractable Assessment & Plan: Improving Patient currently using oxygen right now for cluster headaches Medication management by neurologist Dr. Flint Melter and handicap placard paperwork filled out for patient.     Paroxysmal supraventricular tachycardia (HCC) Assessment & Plan: Patient currently wearing a Zios monitor again for irregular heart rate. Last seen by cardiologist Thomasene Ripple, DO 08/26/23 Continue present management of medications          Follow-up: No follow-ups on file.  An After Visit Summary was printed and given to the patient.  Total time spent on today's visit was  greater than 30 minutes, including both face-to-face time and non face-to-face time personally spent on review of chart (labs and imaging), discussing labs and goals, discussing further work-up, treatment options, referrals to specialist if needed, reviewing outside records if pertinent, answering patient's questions, and coordinating care.   Lajuana Matte, FNP [facility] [phone]

## 2023-09-06 NOTE — Assessment & Plan Note (Signed)
Improving Patient currently using oxygen right now for cluster headaches Medication management by neurologist Dr. Flint Melter and handicap placard paperwork filled out for patient.

## 2023-10-03 DIAGNOSIS — R5383 Other fatigue: Secondary | ICD-10-CM | POA: Diagnosis not present

## 2023-10-03 DIAGNOSIS — Z955 Presence of coronary angioplasty implant and graft: Secondary | ICD-10-CM | POA: Diagnosis not present

## 2023-10-03 DIAGNOSIS — I2510 Atherosclerotic heart disease of native coronary artery without angina pectoris: Secondary | ICD-10-CM | POA: Diagnosis not present

## 2023-10-03 DIAGNOSIS — I252 Old myocardial infarction: Secondary | ICD-10-CM | POA: Diagnosis not present

## 2023-10-03 DIAGNOSIS — K219 Gastro-esophageal reflux disease without esophagitis: Secondary | ICD-10-CM | POA: Diagnosis not present

## 2023-10-03 DIAGNOSIS — E782 Mixed hyperlipidemia: Secondary | ICD-10-CM | POA: Diagnosis not present

## 2023-10-03 DIAGNOSIS — I10 Essential (primary) hypertension: Secondary | ICD-10-CM | POA: Diagnosis not present

## 2023-10-03 DIAGNOSIS — Z79899 Other long term (current) drug therapy: Secondary | ICD-10-CM | POA: Diagnosis not present

## 2023-10-18 DIAGNOSIS — R0683 Snoring: Secondary | ICD-10-CM | POA: Diagnosis not present

## 2023-10-18 DIAGNOSIS — R400 Somnolence: Secondary | ICD-10-CM | POA: Diagnosis not present

## 2023-10-26 DIAGNOSIS — Z955 Presence of coronary angioplasty implant and graft: Secondary | ICD-10-CM | POA: Diagnosis not present

## 2023-10-26 DIAGNOSIS — I2510 Atherosclerotic heart disease of native coronary artery without angina pectoris: Secondary | ICD-10-CM | POA: Diagnosis not present

## 2023-10-26 DIAGNOSIS — I252 Old myocardial infarction: Secondary | ICD-10-CM | POA: Diagnosis not present

## 2023-11-14 ENCOUNTER — Ambulatory Visit (INDEPENDENT_AMBULATORY_CARE_PROVIDER_SITE_OTHER): Payer: Medicare Other | Admitting: Audiology

## 2023-11-14 ENCOUNTER — Encounter (INDEPENDENT_AMBULATORY_CARE_PROVIDER_SITE_OTHER): Payer: Self-pay

## 2023-11-14 ENCOUNTER — Ambulatory Visit (INDEPENDENT_AMBULATORY_CARE_PROVIDER_SITE_OTHER): Payer: Medicare Other | Admitting: Otolaryngology

## 2023-11-14 VITALS — Ht 70.0 in | Wt 150.0 lb

## 2023-11-14 DIAGNOSIS — H903 Sensorineural hearing loss, bilateral: Secondary | ICD-10-CM

## 2023-11-14 DIAGNOSIS — R42 Dizziness and giddiness: Secondary | ICD-10-CM

## 2023-11-14 NOTE — Progress Notes (Signed)
  [address] [place], [place] [address] [phone]  Audiological Evaluation    Name: Kurt Wagner     DOB:   [birth year]      MRN:   [id number]                                                                                     Service Date: 11/14/2023       Patient comes today after Dr. Suszanne Conners, ENT sent a referral for a hearing evaluation due to concerns with hearing loss.   Symptoms Yes Details  Hearing loss  [x]  Both ears  Tinnitus  []    Ear pain/ Ear infections  []    Balance problems  [x]  Denied- said it resolved after his MD adjusted his blood pressure medications  Noise exposure  []    Previous ear surgeries  []    Family history  [x]  Grandfather and mother developed hearing loss with age  Amplification  []    Other  []      Otoscopy: Right ear: Clear external ear canals and notable landmarks visualized on the tympanic membrane. Left ear:  Clear external ear canals and notable landmarks visualized on the tympanic membrane.  Tympanometry: Right ear: Type Ad- Normal external ear canal volume with normal middle ear pressure and high tympanic membrane compliance Left ear: Type A- Normal external ear canal volume with normal middle ear pressure and tympanic membrane compliance    Pure tone Audiometry: Right ear- Normal to moderately severe sensorineural hearing loss from 250 Hz - 8000 Hz. Left ear-  Normal to severe sensorineural hearing loss from 250 Hz - 8000 Hz.  The hearing test results were completed under headphones and re-checked with inserts and results are deemed to be of good reliability. Test technique:  conventional     Speech Audiometry: Right ear- Speech Reception Threshold (SRT) was obtained at 35 dBHL Left ear-Speech Reception Threshold (SRT) was obtained at 35 dBHL   Word Recognition Score Tested using NU-6 (MLV) Right ear: 88% was obtained at a presentation level of 80 dBHL with contralateral masking which is deemed as  good  Left  ear: 88% was obtained at a presentation level of 80 dBHL with contralateral masking which is deemed as  good      Recommendations: Follow up with ENT as scheduled for today. Return for a hearing evaluation if concerns with hearing changes arise or per MD recommendation. Consider a communication needs assessment after medical clearance for hearing aids is obtained.   Jocilyn Trego MARIE LEROUX-MARTINEZ, AUD

## 2023-11-15 ENCOUNTER — Ambulatory Visit: Payer: Medicare Other | Attending: Cardiology | Admitting: Cardiology

## 2023-11-15 ENCOUNTER — Encounter: Payer: Self-pay | Admitting: Cardiology

## 2023-11-15 VITALS — BP 134/82 | HR 76 | Ht 70.0 in | Wt 159.8 lb

## 2023-11-15 DIAGNOSIS — I4729 Other ventricular tachycardia: Secondary | ICD-10-CM | POA: Insufficient documentation

## 2023-11-15 DIAGNOSIS — I10 Essential (primary) hypertension: Secondary | ICD-10-CM | POA: Diagnosis not present

## 2023-11-15 DIAGNOSIS — R42 Dizziness and giddiness: Secondary | ICD-10-CM | POA: Insufficient documentation

## 2023-11-15 DIAGNOSIS — I471 Supraventricular tachycardia, unspecified: Secondary | ICD-10-CM | POA: Diagnosis not present

## 2023-11-15 DIAGNOSIS — I493 Ventricular premature depolarization: Secondary | ICD-10-CM | POA: Diagnosis not present

## 2023-11-15 DIAGNOSIS — E785 Hyperlipidemia, unspecified: Secondary | ICD-10-CM | POA: Insufficient documentation

## 2023-11-15 DIAGNOSIS — H903 Sensorineural hearing loss, bilateral: Secondary | ICD-10-CM | POA: Insufficient documentation

## 2023-11-15 DIAGNOSIS — I2510 Atherosclerotic heart disease of native coronary artery without angina pectoris: Secondary | ICD-10-CM | POA: Diagnosis not present

## 2023-11-15 MED ORDER — DILTIAZEM HCL ER COATED BEADS 240 MG PO CP24
240.0000 mg | ORAL_CAPSULE | Freq: Every day | ORAL | 3 refills | Status: DC
Start: 2023-11-15 — End: 2024-04-27

## 2023-11-15 NOTE — Progress Notes (Signed)
Cardiology Office Note:    Date:  11/15/2023   ID:  Kurt Wagner, DOB [birth year], MRN [id number]  PCP:  Blane Ohara, MD  Cardiologist:  Thomasene Ripple, DO  Electrophysiologist:  None   Referring MD: Blane Ohara, MD     History of Present Illness:    Kurt Wagner is a 62 y.o. male with a hx of STEMI, DES, coronary artery disease, hyperlipidemia, hypertension,ventricular tachycardia.   At his last visit he was here for a second opinion.  We did a monitor.  His monitor did show that he was experiencing 1% PVCs as well as paroxysmal supraventricular tachycardia. He is here today for follow-up visit.  He is asking to stay with our practice that he wants to transfer his care from [facility].  He presents with occasional skipped heartbeats. He reports that his energy level has improved, but he still limits his activities due to the skipped beats. The patient had an echocardiogram last month, the results of which were reported as normal. He is currently on medication for migraines and sleep, and is due to see a neurologist in January. The patient's spouse reports that the patient's previous cardiology team was not communicating effectively, leading to confusion about medication changes. The patient expresses a desire to switch to the current cardiologist for continuity of care within the same healthcare system.  Past Medical History:  Diagnosis Date   Ectopic atrial rhythm 12/26/2019   Essential (primary) hypertension    Essential hypertension 12/26/2019   Gastro-esophageal reflux disease with esophagitis    Intervertebral disc disorders with myelopathy of lumbar region    Lipoma of back 06/24/2016   Mixed hyperlipidemia    Nicotine dependence unspecified, with withdrawal    Precordial pain 12/26/2019   Status post coronary artery stent placement 05/12/2022   STEMI involving left anterior descending coronary artery (HCC) 05/12/2022   Formatting of this note might be different  from the original.  DES to LAD and POBA to diagonal by Dr. Reuel Boom.   Tobacco use 12/26/2019    Past Surgical History:  Procedure Laterality Date   APPENDECTOMY     CHEST SURGERY     Tumor inside chest   CORONARY STENT PLACEMENT  05/12/2022   Xience Skypoint 3.0 mm x 33 mmCSN: [id number] UDI-[id number] REF: [id number], LOT [id number]   HERNIA REPAIR     LUMBAR DISC SURGERY     X3   TUMOR EXCISION     From back    Current Medications: Current Meds  Medication Sig   ASPIRIN 81 PO Take 81 mg by mouth daily.   atorvastatin (LIPITOR) 80 MG tablet Take 80 mg by mouth daily.   diltiazem (CARDIZEM CD) 240 MG 24 hr capsule Take 1 capsule (240 mg total) by mouth daily.   diphenhydrAMINE (BENADRYL) 25 mg capsule Take 25 mg by mouth 2 (two) times daily.   meclizine (ANTIVERT) 25 MG tablet Take 1 tablet (25 mg total) by mouth 3 (three) times daily as needed for dizziness.   nitroGLYCERIN (NITROSTAT) 0.4 MG SL tablet Place 1 tablet (0.4 mg total) under the tongue every 5 (five) minutes as needed for chest pain.   pantoprazole (PROTONIX) 40 MG tablet Take 1 tablet by mouth daily.   topiramate (TOPAMAX) 50 MG tablet Take 1 pill at bedtime for 1 week, then increase to 1 pill twice a day   [DISCONTINUED] diltiazem (CARDIZEM CD) 180 MG 24 hr capsule Take 180 mg by mouth daily.     Allergies:  Azithromycin, Fentanyl, Simvastatin, Tape, Varenicline, and Wound dressing adhesive   Social History   Socioeconomic History   Marital status: Married    Spouse name: Not on file   Number of children: 2   Years of education: Not on file   Highest education level: Not on file  Occupational History   Not on file  Tobacco Use   Smoking status: Every Day    Current packs/day: 0.50    Average packs/day: 0.5 packs/day for 49.0 years (24.5 ttl pk-yrs)    Types: Cigarettes   Smokeless tobacco: Former  Building services engineer status: Former  Substance and Sexual Activity   Alcohol use: Not  Currently    Alcohol/week: 12.0 standard drinks of alcohol    Types: 12 Cans of beer per week   Drug use: Never   Sexual activity: Yes    Partners: Female  Other Topics Concern   Not on file  Social History Narrative   Not on file   Social Drivers of Health   Financial Resource Strain: Low Risk  (05/18/2023)   Overall Financial Resource Strain (CARDIA)    Difficulty of Paying Living Expenses: Not hard at all  Food Insecurity: Low Risk  (10/18/2023)   Received from [facility]   Hunger Vital Sign    Worried About Running Out of Food in the Last Year: Never true    Ran Out of Food in the Last Year: Never true  Transportation Needs: No Transportation Needs (10/18/2023)   Received from [facility]    In the past 12 months, has lack of reliable transportation kept you from medical appointments, meetings, work or from getting things needed for daily living? : No  Physical Activity: Insufficiently Active (05/18/2023)   Exercise Vital Sign    Days of Exercise per Week: 3 days    Minutes of Exercise per Session: 30 min  Stress: No Stress Concern Present (05/18/2023)   [employer] of Occupational Health - Occupational Stress Questionnaire    Feeling of Stress : Not at all  Social Connections: Not on file     Family History: The patient's family history includes Arthritis in his mother; Atrial fibrillation in his father; Diabetes in his mother; Hypertension in his mother.  ROS:   Review of Systems  Constitution: Negative for decreased appetite, fever and weight gain.  HENT: Negative for congestion, ear discharge, hoarse voice and sore throat.   Eyes: Negative for discharge, redness, vision loss in right eye and visual halos.  Cardiovascular: Negative for chest pain, dyspnea on exertion, leg swelling, orthopnea and palpitations.  Respiratory: Negative for cough, hemoptysis, shortness of breath and snoring.   Endocrine: Negative for heat intolerance and  polyphagia.  Hematologic/Lymphatic: Negative for bleeding problem. Does not bruise/bleed easily.  Skin: Negative for flushing, nail changes, rash and suspicious lesions.  Musculoskeletal: Negative for arthritis, joint pain, muscle cramps, myalgias, neck pain and stiffness.  Gastrointestinal: Negative for abdominal pain, bowel incontinence, diarrhea and excessive appetite.  Genitourinary: Negative for decreased libido, genital sores and incomplete emptying.  Neurological: Negative for brief paralysis, focal weakness, headaches and loss of balance.  Psychiatric/Behavioral: Negative for altered mental status, depression and suicidal ideas.  Allergic/Immunologic: Negative for HIV exposure and persistent infections.    EKGs/Labs/Other Studies Reviewed:    The following studies were reviewed today:   EKG:  The ekg ordered today demonstrates   Recent Labs: 03/09/2023: ALT 10; BUN 9; Creatinine, Ser 0.99; Hemoglobin 15.8; Platelets 173; Potassium  5.0; Sodium 141  Recent Lipid Panel    Component Value Date/Time   CHOL 133 03/09/2023 1142   TRIG 74 03/09/2023 1142   HDL 42 03/09/2023 1142   CHOLHDL 3.2 03/09/2023 1142   LDLCALC 76 03/09/2023 1142    Physical Exam:    VS:  BP 134/82 (BP Location: Right Arm, Patient Position: Sitting, Cuff Size: Normal)   Pulse 76   Ht 5\' 10"  (1.778 m)   Wt 159 lb 12.8 oz (72.5 kg)   SpO2 99%   BMI 22.93 kg/m     Wt Readings from Last 3 Encounters:  11/15/23 159 lb 12.8 oz (72.5 kg)  11/14/23 150 lb (68 kg)  09/06/23 156 lb 9.6 oz (71 kg)     GEN: Well nourished, well developed in no acute distress HEENT: Normal NECK: No JVD; No carotid bruits LYMPHATICS: No lymphadenopathy CARDIAC: S1S2 noted,RRR, no murmurs, rubs, gallops RESPIRATORY:  Clear to auscultation without rales, wheezing or rhonchi  ABDOMEN: Soft, non-tender, non-distended, +bowel sounds, no guarding. EXTREMITIES: No edema, No cyanosis, no clubbing MUSCULOSKELETAL:  No deformity   SKIN: Warm and dry NEUROLOGIC:  Alert and oriented x 3, non-focal PSYCHIATRIC:  Normal affect, good insight  ASSESSMENT:    1. Hyperlipidemia, unspecified hyperlipidemia type   2. Primary hypertension   3. Coronary artery disease involving native coronary artery of native heart without angina pectoris   4. NSVT (nonsustained ventricular tachycardia) (HCC)   5. PVC's (premature ventricular contractions)   6. PSVT (paroxysmal supraventricular tachycardia) (HCC)    PLAN:    NSVT Occasional PVCs PSVT Patient reports occasional skipped beats, but frequency has decreased. Currently on Cardizem 180mg . -Increase Cardizem to 240mg  daily to further reduce skipped beats.  Hyperlipidemia Last LDL was 76, which is above the target of <55 due to history of stents. Currently on Atorvastatin. -Order lipid panel to assess current LDL level. -Consider adding adjunctive therapy to Atorvastatin if LDL remains >55.  CAD -no angina will continue current medication regimen.  General Health Maintenance / Followup Plans -Return visit in 6 months to assess response to increased Cardizem dose and potential changes in lipid management. -Communicate via MyChart for any issues in the interim.   The patient is in agreement with the above plan. The patient left the office in stable condition.  The patient will follow up in   Medication Adjustments/Labs and Tests Ordered: Current medicines are reviewed at length with the patient today.  Concerns regarding medicines are outlined above.  Orders Placed This Encounter  Procedures   Lipid panel   Meds ordered this encounter  Medications   diltiazem (CARDIZEM CD) 240 MG 24 hr capsule    Sig: Take 1 capsule (240 mg total) by mouth daily.    Dispense:  90 capsule    Refill:  3    Patient Instructions   Medication Instructions:  Increase Cardiazem to 240 mg by mouth daily. New script sent.  *If you need a refill on your cardiac medications before your  next appointment, please call your pharmacy*   Lab Work: Lipid profile today. If you have labs (blood work) drawn today and your tests are completely normal, you will receive your results only by: MyChart Message (if you have MyChart) OR A paper copy in the mail If you have any lab test that is abnormal or we need to change your treatment, we will call you to review the results.   Follow-Up: At [facility], you and your health needs are  our priority.  As part of our continuing mission to provide you with exceptional heart care, we have created designated Provider Care Teams.  These Care Teams include your primary Cardiologist (physician) and Advanced Practice Providers (APPs -  Physician Assistants and Nurse Practitioners) who all work together to provide you with the care you need, when you need it.  Your next appointment:   6 month(s)  Provider:   Thomasene Ripple, DO           Adopting a Healthy Lifestyle.  Know what a healthy weight is for you (roughly BMI <25) and aim to maintain this   Aim for 7+ servings of fruits and vegetables daily   65-80+ fluid ounces of water or unsweet tea for healthy kidneys   Limit to max 1 drink of alcohol per day; avoid smoking/tobacco   Limit animal fats in diet for cholesterol and heart health - choose grass fed whenever available   Avoid highly processed foods, and foods high in saturated/trans fats   Aim for low stress - take time to unwind and care for your mental health   Aim for 150 min of moderate intensity exercise weekly for heart health, and weights twice weekly for bone health   Aim for 7-9 hours of sleep daily   When it comes to diets, agreement about the perfect plan isnt easy to find, even among the experts. Experts at the [facility] [employer] developed an idea known as the Healthy Eating Plate. Just imagine a plate divided into logical, healthy portions.   The emphasis is on diet quality:   Load up on  vegetables and fruits - one-half of your plate: Aim for color and variety, and remember that potatoes dont count.   Go for whole grains - one-quarter of your plate: Whole wheat, barley, wheat berries, quinoa, oats, brown rice, and foods made with them. If you want pasta, go with whole wheat pasta.   Protein power - one-quarter of your plate: Fish, chicken, beans, and nuts are all healthy, versatile protein sources. Limit red meat.   The diet, however, does go beyond the plate, offering a few other suggestions.   Use healthy plant oils, such as olive, canola, soy, corn, sunflower and peanut. Check the labels, and avoid partially hydrogenated oil, which have unhealthy trans fats.   If youre thirsty, drink water. Coffee and tea are good in moderation, but skip sugary drinks and limit milk and dairy products to one or two daily servings.   The type of carbohydrate in the diet is more important than the amount. Some sources of carbohydrates, such as vegetables, fruits, whole grains, and beans-are healthier than others.   Finally, stay active  Signed, Thomasene Ripple, DO  11/15/2023 4:19 PM    [facility]

## 2023-11-15 NOTE — Progress Notes (Signed)
Patient ID: Kurt Wagner, male   DOB: 11-14-1961, 62 y.o.   MRN: [id number]  CC: Recurrent dizziness, bilateral hearing loss  HPI:  Kurt Wagner is a 62 y.o. male who presents today for evaluation of his dizziness and hearing loss.  According to the patient, he started experiencing recurrent dizziness 8 months ago.  He describes his dizziness as a lightheaded and off-balance sensation.  It is often accompanied by shortness of breath, especially with exertion.  He denies any spinning vertigo.  His dizziness has improved after he was started on Cardizem.  His last dizziness episode was 4 months ago.  In addition, he also complains of bilateral progressive hearing loss.  He has been having difficulty hearing other people, especially in noisy environments.  He denies any otalgia or otorrhea.  He has no previous ENT surgery.  He denies any recent otitis media or otitis externa.  Past Medical History:  Diagnosis Date   Ectopic atrial rhythm 12/26/2019   Essential (primary) hypertension    Essential hypertension 12/26/2019   Gastro-esophageal reflux disease with esophagitis    Intervertebral disc disorders with myelopathy of lumbar region    Lipoma of back 06/24/2016   Mixed hyperlipidemia    Nicotine dependence unspecified, with withdrawal    Precordial pain 12/26/2019   Status post coronary artery stent placement 05/12/2022   STEMI involving left anterior descending coronary artery (HCC) 05/12/2022   Formatting of this note might be different from the original.  DES to LAD and POBA to diagonal by Dr. Reuel Boom.   Tobacco use 12/26/2019    Past Surgical History:  Procedure Laterality Date   APPENDECTOMY     CHEST SURGERY     Tumor inside chest   CORONARY STENT PLACEMENT  05/12/2022   Xience Skypoint 3.0 mm x 33 mmCSN: [id number] UDI-[id number] REF: [id number], LOT [id number]   HERNIA REPAIR     LUMBAR DISC SURGERY     X3   TUMOR EXCISION     From back    Family History   Problem Relation Age of Onset   Hypertension Mother    Arthritis Mother    Diabetes Mother    Atrial fibrillation Father     Social History:  reports that he has been smoking cigarettes. He has a 24.5 pack-year smoking history. He has quit using smokeless tobacco. He reports that he does not currently use alcohol after a past usage of about 12.0 standard drinks of alcohol per week. He reports that he does not use drugs.  Allergies:  Allergies  Allergen Reactions   Azithromycin Other (See Comments)    unknown   Fentanyl Other (See Comments)    Headache   Simvastatin Other (See Comments)    Joint pain   Tape Other (See Comments)    Blisters   Varenicline Other (See Comments)    unknown   Wound Dressing Adhesive Other (See Comments)    Blisters    Prior to Admission medications   Medication Sig Start Date End Date Taking? Authorizing Provider  ASPIRIN 81 PO Take 81 mg by mouth daily.   Yes [provider]  atorvastatin (LIPITOR) 80 MG tablet Take 80 mg by mouth daily. 05/14/22  Yes [provider]  meclizine (ANTIVERT) 25 MG tablet Take 1 tablet (25 mg total) by mouth 3 (three) times daily as needed for dizziness. 05/18/23  Yes Cox, Kirsten, MD  nitroGLYCERIN (NITROSTAT) 0.4 MG SL tablet Place 1 tablet (0.4 mg total) under the tongue every  5 (five) minutes as needed for chest pain. 03/09/23  Yes Cox, Fritzi Mandes, MD  topiramate (TOPAMAX) 50 MG tablet Take 1 pill at bedtime for 1 week, then increase to 1 pill twice a day 07/27/23  Yes Chima, Victorino Dike, MD  amLODipine (NORVASC) 2.5 MG tablet Take by mouth. Patient not taking: Reported on 11/15/2023 04/04/23 04/03/24  [provider]  clopidogrel (PLAVIX) 75 MG tablet Take 1 tablet by mouth daily. Patient not taking: Reported on 11/15/2023 06/27/23   [provider]  diltiazem (CARDIZEM CD) 240 MG 24 hr capsule Take 1 capsule (240 mg total) by mouth daily. 11/15/23 02/13/24  Tobb, Kardie, DO  diphenhydrAMINE  (BENADRYL) 25 mg capsule Take 25 mg by mouth 2 (two) times daily.    [provider]  pantoprazole (PROTONIX) 40 MG tablet Take 1 tablet by mouth daily. 06/27/23   [provider]    Height 5\' 10"  (1.778 m), weight 150 lb (68 kg). Exam: General: Communicates without difficulty, well nourished, no acute distress. Head: Normocephalic, no evidence injury, no tenderness, facial buttresses intact without stepoff. Face/sinus: No tenderness to palpation and percussion. Facial movement is normal and symmetric. Eyes: PERRL, EOMI. No scleral icterus, conjunctivae clear. Neuro: CN II exam reveals vision grossly intact.  No nystagmus at any point of gaze. Ears: Auricles well formed without lesions.  Ear canals are intact without mass or lesion.  No erythema or edema is appreciated.  The TMs are intact without fluid. Nose: External evaluation reveals normal support and skin without lesions.  Dorsum is intact.  Anterior rhinoscopy reveals congested mucosa over anterior aspect of inferior turbinates and intact septum.  No purulence noted. Oral:  Oral cavity and oropharynx are intact, symmetric, without erythema or edema.  Mucosa is moist without lesions. Neck: Full range of motion without pain.  There is no significant lymphadenopathy.  No masses palpable.  Thyroid bed within normal limits to palpation.  Parotid glands and submandibular glands equal bilaterally without mass.  Trachea is midline. Neuro:  CN 2-12 grossly intact. Vestibular: No nystagmus at any point of gaze. Dix Hallpike negative. Vestibular: There is no nystagmus with pneumatic pressure on either tympanic membrane or Valsalva. The cerebellar examination is unremarkable.    His hearing test shows bilateral high-frequency sensorineural hearing loss.  Assessment: 1.  Recurrent dizziness, likely secondary to systemic and multifactorial causes.  His history is suggestive of cardiovascular insufficiency. 2.  His ear canals, tympanic  membranes, and middle ear spaces are all normal.  His Dix-Hallpike maneuver is negative. 3.  Bilateral symmetric high-frequency sensorineural hearing loss, likely secondary to routine presbycusis.  Plan: 1.  The physical exam findings and the hearing test results are reviewed with the patient. 2.  The pathophysiology of vestibular dysfunction and dizziness are discussed extensively with the patient. The possible differential diagnoses are reviewed. Questions are invited and answered.  Since the patient is currently asymptomatic, the decision is made to proceed with conservative observation for now.  If his dizziness worsens, he may benefit from undergoing vestibular neurodiagnostic testing. 3.  The patient is a candidate for hearing amplification.  Hearing aid options are discussed. 4.  The patient will return for reevaluation in 1 year, sooner if needed.  Kurt Wagner 11/15/2023, 6:11 PM

## 2023-11-15 NOTE — Patient Instructions (Addendum)
  Medication Instructions:  Increase Cardiazem to 240 mg by mouth daily. New script sent.  *If you need a refill on your cardiac medications before your next appointment, please call your pharmacy*   Lab Work: Lipid profile today. If you have labs (blood work) drawn today and your tests are completely normal, you will receive your results only by: MyChart Message (if you have MyChart) OR A paper copy in the mail If you have any lab test that is abnormal or we need to change your treatment, we will call you to review the results.   Follow-Up: At [facility], you and your health needs are our priority.  As part of our continuing mission to provide you with exceptional heart care, we have created designated Provider Care Teams.  These Care Teams include your primary Cardiologist (physician) and Advanced Practice Providers (APPs -  Physician Assistants and Nurse Practitioners) who all work together to provide you with the care you need, when you need it.  Your next appointment:   6 month(s)  Provider:   Thomasene Ripple, DO

## 2023-11-16 LAB — LIPID PANEL
Chol/HDL Ratio: 4.3 {ratio} (ref 0.0–5.0)
Cholesterol, Total: 170 mg/dL (ref 100–199)
HDL: 40 mg/dL (ref >39)
LDL Chol Calc (NIH): 112 mg/dL — ABNORMAL HIGH (ref 0–99)
Triglycerides: 97 mg/dL (ref 0–149)
VLDL Cholesterol Cal: 18 mg/dL (ref 5–40)

## 2023-11-22 ENCOUNTER — Encounter: Payer: Self-pay | Admitting: Audiology

## 2023-11-22 ENCOUNTER — Other Ambulatory Visit: Payer: Self-pay

## 2023-11-22 DIAGNOSIS — E785 Hyperlipidemia, unspecified: Secondary | ICD-10-CM

## 2023-11-22 DIAGNOSIS — Z79899 Other long term (current) drug therapy: Secondary | ICD-10-CM

## 2023-11-22 MED ORDER — EZETIMIBE 10 MG PO TABS
10.0000 mg | ORAL_TABLET | Freq: Every day | ORAL | 3 refills | Status: DC
Start: 2023-11-22 — End: 2024-04-27

## 2023-11-22 NOTE — Progress Notes (Signed)
Prescription sent to pharmacy. Lab orders placed. Lab slips placed in outgoing mail for pt.

## 2023-11-29 DIAGNOSIS — R0683 Snoring: Secondary | ICD-10-CM | POA: Diagnosis not present

## 2023-12-02 DIAGNOSIS — R0683 Snoring: Secondary | ICD-10-CM | POA: Diagnosis not present

## 2023-12-06 DIAGNOSIS — R0683 Snoring: Secondary | ICD-10-CM | POA: Diagnosis not present

## 2023-12-12 ENCOUNTER — Encounter: Payer: Self-pay | Admitting: Neurology

## 2023-12-12 ENCOUNTER — Telehealth: Payer: Self-pay | Admitting: Neurology

## 2023-12-12 ENCOUNTER — Ambulatory Visit (INDEPENDENT_AMBULATORY_CARE_PROVIDER_SITE_OTHER): Payer: Medicare Other | Admitting: Neurology

## 2023-12-12 VITALS — BP 127/83 | HR 80 | Ht 70.0 in | Wt 157.1 lb

## 2023-12-12 DIAGNOSIS — R200 Anesthesia of skin: Secondary | ICD-10-CM

## 2023-12-12 DIAGNOSIS — M5417 Radiculopathy, lumbosacral region: Secondary | ICD-10-CM | POA: Diagnosis not present

## 2023-12-12 DIAGNOSIS — R29898 Other symptoms and signs involving the musculoskeletal system: Secondary | ICD-10-CM | POA: Diagnosis not present

## 2023-12-12 MED ORDER — TOPIRAMATE 50 MG PO TABS: 50.0000 mg | ORAL_TABLET | Freq: Two times a day (BID) | ORAL | 4 refills | Status: AC

## 2023-12-12 NOTE — Telephone Encounter (Signed)
 no auth required sent to GI [phone]

## 2023-12-12 NOTE — Progress Notes (Signed)
 CC:  headaches  Follow-up Visit  12/12/2023: Today patient is here for follow-up of cluster headaches, this is a patient that was seeing Dr. Rush in our practice and is transitioning to my care.  He was last seen in August 2024 and I reviewed the notes, he was having right-sided headaches, associated with nasal congestion and right ptosis, occurring at the same time every day and lasting 2 to 3 hours at a time.  Diagnosed with cluster headaches.  It appears he was started on 300 mg of Emgality  a month, given oxygen and topiramate .  He has tried a plethora of medications per Dr. Hartwell notes (see below).  When she saw him last he was in a cluster cycle.  He previously tried prednisone  with [employer] the headaches worse.  He could not take verapamil  as he was already taking calcium  channel blockers for heart condition.  Cannot take triptans due to coronary artery disease. Per review of records, he saw his pcp 09/06/2023 for headaches and dizziness, he was wearing  aheart monitor, started with cluster headaches in the 1990s and O2 appeared to be helping. He was also seen for his snoring and possible sleep evaluation which is excellent.he saw ENT Dr. Karis for dizziness who stated Recurrent dizziness, likely secondary to systemic and multifactorial causes. His history is suggestive of cardiovascular insufficiency.  And Bilateral symmetric high-frequency sensorineural hearing loss, likely secondary to routine presbycusis.  And he was sent to audiology. He is still smoking? Per Darin Graven: coronary artery disease, STEMI involving left anterior descending coronary artery, paroxysmal supraventricular tachycardia, hypertension, hyperlipidemia, bradycardia, fatigue, cervical spine stenosis with radiculopathy at C6 and snoring. He has medication allergy to azithromycin, fentanyl , simvastatin and Varenicline. He is a pack-a-day smoker who started smoking around age 72 (31)  but sleep eval results showed [employer]  Home sleep study 11/29/2023 has an AHI of 0.4 which is not diagnostic of obstructive sleep apnea at this time .   He could not afford the emgality . But the topiramate  combined with the oxygen was amazingly effective. His cycle is over and he still has oxygen left when the next one happens, they usually start around August and last for a few months until October. Continue the topiramate  to once a day and at the start of the cycle go back to twice a day. He also has numbness of the left top lateral of the left foot. He is still smoking, discussed risks of smoking. He has back pain, new numbness in the left foot, weakness in leg in a pattern of L5/S1.   Last visit: 11/02/22  Brief HPI: 63 year old male with a history of CAD, STEMI s/p LAD stent, HTN, smoking, cervical radiculopathy who follows in clinic for cluster headaches.  Interval History: He has started to have right-sided headaches in the past 4 days. They are associated with nasal congestion and right ptosis. They have been occurring between 4-5 pm every day. They last ~2-3 hours at a time. He is taking Advil migraine which has only been somewhat effective.    Headache days per month: 4 Headache free days per month: 26  Current Headache Regimen: Preventative: none Abortive: Advil   Prior Therapies                                  Prevention: Metoprolol 25 mg BID Carvedilol  6.25 mg BID Amlodipine 2.5 mg  Diltiazem  120 mg daily Lisinopril   20 mg daily Cymbalta 60 mg daily Nortriptyline 150 mg QHS Topamax  25 mg BID Prednisone  - worsened headache   Rescue: ibuprofen Meloxicam Maxalt  10 mg PRN Cannot take triptans due to CAD  Physical Exam:  Physical exam: Exam: Gen: NAD, conversant, well nourised, well groomed                     CV: RRR, no MRG. No Carotid Bruits. No peripheral edema, warm, nontender Eyes: Conjunctivae clear without exudates or hemorrhage  Neuro: Detailed Neurologic Exam  Speech:    Speech is  normal; fluent and spontaneous with normal comprehension.  Cognition:    The patient is oriented to person, place, and time;     recent and remote memory intact;     language fluent;     normal attention, concentration,     fund of knowledge Cranial Nerves:    The pupils are equal, round, and reactive to light. The fundi are normal and spontaneous venous pulsations are present. Visual fields are full to finger confrontation. Extraocular movements are intact. Trigeminal sensation is intact and the muscles of mastication are normal. The face is symmetric. The palate elevates in the midline. Hearing intact. Voice is normal. Shoulder shrug is normal. The tongue has normal motion without fasciculations.   Coordination: nml  Gait: nml  Motor Observation:    No asymmetry, no atrophy, and no involuntary movements noted. Tone:    Normal muscle tone.    Posture:    Posture is normal. normal erect    Strength: left foot mild weakness inversion/eversion/dorsiflexion, mild weakness leg leg flexion. Otherwise strength is V/V in the upper and lower limbs.      Sensation: left foot numbness     Reflex Exam:  DTR's:    Deep tendon reflexes in the upper and lower extremities are normal bilaterally. (Sighlty decreased left AJ as compared to right)  Toes:    The toes are downgoing bilaterally.   Clonus:    Clonus is absent.  IMPRESSION: 63 year old male with a history of CAD, STEMI s/p LAD stent, HTN, smoking, cervical radiculopathy who presents for follow up of cluster headaches. He has begun to enter a new cluster headache cycle starting 4 days ago. Previously tried prednisone  which made his headaches worse. Cannot take verapamil  as he is already taking calcium  channel blockers for PSVT and HTN. Will start Emgality  for cluster headaches. Sample provided in the office today. Cannot take triptans due to CAD. Will work on getting home oxygen set up to take as needed for cluster  headaches.  PLAN: -Prevention: If can get Emgality  in the future can start  300 mg per month(was too expensive). Take monthly until cluster cycle ends. If we have amples we can at least inject him at the onset of cluster cycle. Can see him begiining of August and preemptively try to inject emgality  samples.   -Rescue: Oxygen PRN still has some can reprder if needed - low dose topiramate  can go to one at bedtime now but increse back to twice daily early augusy - Current smoker since 1973: Smoking is considered a significant trigger for cluster headaches, with studies showing a strong association between cigarette use and the development or worsening of cluster headache symptoms; meaning that people who smoke are more likely to experience cluster headaches, and smoking can potentially make existing cluster headaches more severe - discussed heredity: per meta AI: cluster headaches can be hereditary in some families:  Likelihood of  inheritance. First-degree relatives of someone with cluster headaches are 5-18 times more likely to have them, and second-degree relatives are 1-3 times more likely - He has tried steroids at onset and made it worse, will not try that - It is so painful when he has them he felt he had to take the guns out of the house, discussed these are called suicide headaches and if he feels that way again CALL 911 wife is quite aware of the situation and watches him closely.  - discussed studies on psilocybin but not FDA approved or legal at this time - He has back pain, new numbness in the left foot, weakness in leg in a pattern of L5/S1. He has had 3 back surgies. Cannot rule out peroneal neuropathy. Ongoing for > 3 months new symptoms, failed conservative tehrapy, has been udne rthe care of multiple physicians at this time would recommend MRi lumabr spine and emg/ncs. Likely L5, left L5 or S1 radiculopathy likely L5. He has had physical therpy for his back > 6 weeks.  - Hold 300mg   emgality  samples for the start of his cluster headaches mid aug will see him early August  Meds ordered this encounter  Medications   topiramate  (TOPAMAX ) 50 MG tablet    Sig: Take 1 tablet (50 mg total) by mouth 2 (two) times daily.    Dispense:  180 tablet    Refill:  4   Orders Placed This Encounter  Procedures   MR LUMBAR SPINE WO CONTRAST   NCV with EMG(electromyography)     Follow-up: beginning of August right before his usual cluster cycle, or sooner if needed, at the begiining  of August start topiramate  bid again and  will try to Hold 300mg  emgality  samples for the start of his cluster headaches mid aug will see him early august  I spent over 40 minutes of face-to-face and non-face-to-face time with patient on the  1. Lumbosacral radiculopathy at L5   2. Left leg numbness   3. Left leg weakness    diagnosis.  This included previsit chart review, lab review, study review, order entry, electronic health record documentation, patient education on the different diagnostic and therapeutic options, counseling and coordination of care, risks and benefits of management, compliance, or risk factor reduction

## 2023-12-12 NOTE — Patient Instructions (Addendum)
-  Rescue: Oxygen PRN still has some can reprder if needed - low dose topiramate  can go to one at bedtime now but increse back to twice daily early augusy - Current smoker since 1973: Smoking is considered a significant trigger for cluster headaches, with studies showing a strong association between cigarette use and the development or worsening of cluster headache symptoms; meaning that people who smoke are more likely to experience cluster headaches, and smoking can potentially make existing cluster headaches more severe - discussed heredity: per meta AI: cluster headaches can be hereditary in some families:  Likelihood of inheritance. First-degree relatives of someone with cluster headaches are 5-18 times more likely to have them, and second-degree relatives are 1-3 times more likely - He has tried steroids at onset and made it worse, will not try that - It is so painful when he has them he felt he had to take the guns out of the house, discussed these are called suicide headaches and if he feels that way again CALL 911 wife is quite aware of the situation and watches him closely.  - discussed studies on psilocybin but not FDA approved or legal at this time - He has back pain, new numbness in the left foot, weakness in leg in a pattern of L5/S1. He has had 3 back surgies. Cannot rule out peroneal neuropathy. Ongoing for > 3 months new symptoms, failed conservative tehrapy, has been udne rthe care of multiple physicians at this time would recommend MRi lumabr spine and emg/ncs. Likely L5, left L5 or S1 radiculopathy likely L5. He has had physical therpy for his back > 6 weeks.  - Hold 300mg  emgality  samples for the start of his cluster headaches mid aug will see him early august  Follow-up: beginning of August right before his usual cluster cycle, or sooner if needed, at the begiining  of August start topiramate  bid again and  will try to Hold 300mg  emgality  samples for the start of his cluster  headaches mid aug will see him early August  Meds ordered this encounter  Medications   topiramate  (TOPAMAX ) 50 MG tablet    Sig: Take 1 tablet (50 mg total) by mouth 2 (two) times daily.    Dispense:  180 tablet    Refill:  4   Orders Placed This Encounter  Procedures   MR LUMBAR SPINE WO CONTRAST   NCV with EMG(electromyography)

## 2024-01-03 ENCOUNTER — Ambulatory Visit (INDEPENDENT_AMBULATORY_CARE_PROVIDER_SITE_OTHER): Payer: Self-pay | Admitting: Neurology

## 2024-01-03 ENCOUNTER — Ambulatory Visit: Payer: Medicare Other | Admitting: Neurology

## 2024-01-03 DIAGNOSIS — R200 Anesthesia of skin: Secondary | ICD-10-CM

## 2024-01-03 DIAGNOSIS — R29898 Other symptoms and signs involving the musculoskeletal system: Secondary | ICD-10-CM

## 2024-01-03 DIAGNOSIS — Z0289 Encounter for other administrative examinations: Secondary | ICD-10-CM

## 2024-01-03 DIAGNOSIS — M5417 Radiculopathy, lumbosacral region: Secondary | ICD-10-CM

## 2024-01-03 NOTE — Progress Notes (Signed)
 Full Name: Kurt Wagner Gender: Male MRN #: [id number] Date of Birth: December 15, 1960    Visit Date: 01/03/2024 08:11 Age: 63 Years Examining Physician: Dr. Onetha Epp Referring Physician: Dr. Onetha Epp Height: 5 feet 10 inch     History:  He has chronic back pain and has a hx of back surgeries but now with new numbness in the left foot, weakness in leg in a pattern of L5/S1 or possible peroneal neuropathy(recent 55 lb weight loss)   Summary. NCS performed on the lower extremities. All nerves and (as indicated in the following tables) were within normal limits.  EMG was performed on the left lower extremity: Could not perform emg needle exam on the lumbar paraspinal muscles as those are unreliable after surgery. The right tibialis anterior showed polyphasic motor units and diminished motor unit recruitment.  The right tibialis posterior showed prolonged motor unit duration and diminished motor unit recruitment.  The right peroneus longus showed prolonged motor unit duration, polyphasic motor units and diminished motor unit recruitment.  The right medial gastrocnemius showed prolonged motor unit duration, increased motor unit amplitude, polyphasic motor units and diminished motor unit recruitment.  The right extensor hallucis longus showed prolonged motor unit duration, polyphasic motor units and diminished motor unit recruitment. All remaining muscles (as indicated in the following tables) were within normal limits.      Conclusion: There are chronic neurogenic changes in muscles that share L5/S1 innervation c/w remote lumbar radiculopathy. Given current symptoms suspect superficial peroneal neuropathy. Pending MRi lumbar spine.      ------------------------------- Onetha Epp, M.D.  [facility] [address], [address] [place], [place] [address] Tel: [phone] Fax: [phone]  Verbal informed consent was obtained from the patient, patient was informed of  potential risk of procedure, including bruising, bleeding, hematoma formation, infection, muscle weakness, muscle pain, numbness, among others.        MNC    Nerve / Sites Muscle Latency Ref. Amplitude Ref. Rel Amp Segments Distance Velocity Ref. Area    ms ms mV mV %  cm m/s m/s mVms  L Peroneal - EDB     Ankle EDB 6.2 <=6.5 2.2 >=2.0 100 Ankle - EDB 9   6.1     Fib head EDB 12.6  2.6  116 Fib head - Ankle 28 44 >=44 8.5     Pop fossa EDB 14.8  3.0  119 Pop fossa - Fib head 10 46 >=44 12.5         Pop fossa - Ankle      R Peroneal - EDB     Ankle EDB 3.9 <=6.5 2.9 >=2.0 100 Ankle - EDB 9   7.9     Fib head EDB 10.3  2.4  81 Fib head - Ankle 29 46 >=44 7.4     Pop fossa EDB 12.4  2.2  92 Pop fossa - Fib head 10.6 51 >=44 7.5         Pop fossa - Ankle      L Tibial - AH     Ankle AH 4.7 <=5.8 6.4 >=4.0 100 Ankle - AH 9   18.7     Pop fossa AH 14.4  4.8  74.8 Pop fossa - Ankle 40 41 >=41 15.8  R Tibial - AH     Ankle AH 4.3 <=5.8 4.3 >=4.0 100 Ankle - AH 9   15.2     Pop fossa AH 14.1  3.6  83.3 Pop  fossa - Ankle 40 41 >=41 15.6  L Peroneal - Tib Ant     Fib Head Tib Ant 3.3 <=4.7 3.4 >=3.0 100 Fib Head - Tib Ant 10   6.8     Pop fossa Tib Ant 4.8  2.5  71.9 Pop fossa - Fib Head 9 58 >=44 4.0               SNC    Nerve / Sites Rec. Site Peak Lat Ref.  Amp Ref. Segments Distance    ms ms V V  cm  L Sural - Ankle (Calf)     Calf Ankle 3.5 <=4.4 8 >=6 Calf - Ankle 14  R Sural - Ankle (Calf)     Calf Ankle 4.4 <=4.4 10 >=6 Calf - Ankle 14  L Superficial peroneal - Ankle     Lat leg Ankle 4.3 <=4.4 9 >=6 Lat leg - Ankle 14  R Superficial peroneal - Ankle     Lat leg Ankle 3.9 <=4.4 10 >=6 Lat leg - Ankle 14             F  Wave    Nerve F Lat Ref.   ms ms  L Tibial - AH 51.5 <=56.0  R Tibial - AH 49.5 <=56.0         EMG Summary Table    Spontaneous MUAP Recruitment  Muscle IA Fib PSW Fasc Other Amp Dur. Poly Pattern  L. Vastus medialis  None None None _______ Normal  Normal Normal Normal  L. Tibialis anterior Normal None None None _______ Normal Normal 1+ Reduced  L. Tibialis posterior Normal None None None _______ Normal Increased Normal Reduced  L. Peroneus longus Normal None None None _______ Normal Increased 4+ Reduced  L. Gluteus maximus Normal None None None _______ Normal Normal Normal Normal  L. Gluteus medius Normal None None None _______ Normal Normal Normal Normal  L. Gastrocnemius (Medial head) Normal None None None _______ Increased Increased 2+ Reduced  L. Biceps femoris (long head) Normal None None None _______ Normal Normal Normal Normal  L. Biceps femoris (short head) Normal None None None _______ Normal Normal Normal Normal  L. Extensor hallucis longus Normal None None None _______ Normal Increased 4+ Single

## 2024-01-03 NOTE — Patient Instructions (Addendum)
 Superficial Peroneal Nerve Entrapment      Peroneal nerve weight loss refers to a condition where rapid and significant weight loss can lead to damage to the peroneal nerve, often resulting in a condition called slimmer's paralysis characterized by foot drop due to weakness in the muscles controlled by this nerve; essentially, when someone loses a large amount of weight quickly, the fat padding around the peroneal nerve can diminish, causing it to become compressed and damaged, leading to nerve dysfunction.  Key points about peroneal nerve and weight loss: Mechanism: When someone loses weight rapidly, the fat pad that protects the peroneal nerve at the fibular head can shrink, causing the nerve to become compressed and irritated, leading to symptoms like foot drop.  Symptoms: The primary symptom of peroneal nerve damage due to weight loss is foot drop, where the person has difficulty lifting the front of their foot, causing an awkward walking gait.  Risk factors: Rapid weight loss: The faster the weight loss, the higher the risk of peroneal nerve damage.  Significant weight loss: Losing a large amount of weight in a short period can put pressure on the peroneal nerve.  Superficial peroneal nerve entrapment is a condition that results from pressure on a nerve in the lower leg (superficial peroneal nerve). This nerve provides feeling to the outside half of the front of your lower leg and the top of your foot and toes. It also supplies the outer muscles of your lower leg that help your foot move outward. The superficial peroneal nerve begins below the outside of your knee and runs down along your lower leg bone (fibula) to the top of your foot. Superficial peroneal nerve entrapment can cause weakness and numbness in your leg and foot. It may also cause pain. This can happen anywhere from your lower leg to your ankle. What are the causes? This condition may be caused by: A hard, direct hit to  the outside of your lower leg. Ankle sprains. A break (fracture) in the fibula. Compression across sitting too straight or sitting cross legge Weight is a risk factor What increases the risk? You are more likely to develop this condition if you take part in certain sports or activities, such as: Ballet dancing. Contact sports, such as football, lacrosse, or martial arts. Sports that involve changing direction quickly, such as soccer. Sports that take place on uneven surfaces, such as trail running. Sports that involve wearing high boots, such as skiing. What are the signs or symptoms? Symptoms of this condition include: Numbness and tingling over the outside half of your shin and the top of your foot and toes. Pain or tenderness on the outside of your leg or the top of your foot. Inability to move your foot outward or weakness when doing so. Symptoms of this condition may start quickly or may develop over time. They often get better with rest and get worse after activity. How is this diagnosed? This condition may be diagnosed based on: Your symptoms and medical history. A physical exam. During the exam, your health care provider may: Check for numbness and test the strength of your lower leg muscles. Tap the side of your leg or ankle to see if it causes a tingling sensation. Inject a numbing medicine into the nerve to see if your symptoms go away. Imaging tests, such as: X-rays to check your ankle and fibula for fractures. An MRI to check tendons and ligaments. An ultrasound to check the nerve. An electrical study of the  nerve's function (electromyography, or EMG). How is this treated? Treatment for this condition may include: Using crutches. Avoiding activities that make symptoms worse. Taking anti-inflammatory pain medicines to relieve swelling and reduce pain. Having medicines injected near your nerve to reduce pain and swelling. Starting range-of-motion exercises and  strengthening exercises (physical therapy). Returning gradually to full activity. Wearing a supportive brace, a shoe, or a shoe insert (orthotic). Surgery to take pressure off the nerve may be needed if there is no improvement in 2-3 months or if there is a growth on the nerve. Follow these instructions at home: If you have a removable brace: Wear the brace as told by your health care provider. Remove it only as told by your health care provider. Check the skin around the brace every day. Tell your health care provider about any concerns. Loosen the brace if your toes tingle, become numb, or turn cold and blue. Keep the brace clean and dry. If the brace is not waterproof: Do not let it get wet. Cover it with a watertight covering when you take a bath or shower. Activity Do not do any activities that make pain or swelling worse. Do not use the injured limb to support your body weight until your health care provider says that you can. Use crutches as told by your health care provider. Do exercises as told by your health care provider. Return to your normal activities as told by your health care provider. Ask your health care provider what activities are safe for you. General instructions Take over-the-counter and prescription medicines only as told by your health care provider. Wear your supportive shoe or your shoe insert as told by your health care provider. Keep all follow-up visits. This is important. How is this prevented? Wear supportive footwear that is appropriate for your athletic activity. Make sure your shoes or boots fit well and are not too tight. See your health care provider if you have an ankle sprain that causes pain and swelling for more than 2 weeks. If you start a new athletic activity, start gradually to build up your strength and flexibility. Contact a health care provider if: Your symptoms do not improve in 2-3 months. You have increasing weakness or numbness in your  leg or foot. Summary Superficial peroneal nerve entrapment is a condition that results from pressure on a nerve in the lower leg. This condition can cause weakness, pain, and numbness in your leg and foot. It is caused by injury to the lower leg. It is treated with rest, medicines to reduce pain and swelling, physical therapy, and surgery if needed. Contact a health care provider if your symptoms do not improve in 2-3 months. This information is not intended to replace advice given to you by your health care provider. Make sure you discuss any questions you have with your health care provider. Document Revised: 04/16/2021 Document Reviewed: 04/16/2021 Elsevier Patient Education  2024 [employer].

## 2024-01-05 NOTE — Progress Notes (Signed)
 See procedure note.

## 2024-01-05 NOTE — Procedures (Signed)
 Full Name: Kurt Wagner Gender: Male MRN #: [id number] Date of Birth: December 15, 1960    Visit Date: 01/03/2024 08:11 Age: 63 Years Examining Physician: Dr. Onetha Epp Referring Physician: Dr. Onetha Epp Height: 5 feet 10 inch     History:  He has chronic back pain and has a hx of back surgeries but now with new numbness in the left foot, weakness in leg in a pattern of L5/S1 or possible peroneal neuropathy(recent 55 lb weight loss)   Summary. NCS performed on the lower extremities. All nerves and (as indicated in the following tables) were within normal limits.  EMG was performed on the left lower extremity: Could not perform emg needle exam on the lumbar paraspinal muscles as those are unreliable after surgery. The right tibialis anterior showed polyphasic motor units and diminished motor unit recruitment.  The right tibialis posterior showed prolonged motor unit duration and diminished motor unit recruitment.  The right peroneus longus showed prolonged motor unit duration, polyphasic motor units and diminished motor unit recruitment.  The right medial gastrocnemius showed prolonged motor unit duration, increased motor unit amplitude, polyphasic motor units and diminished motor unit recruitment.  The right extensor hallucis longus showed prolonged motor unit duration, polyphasic motor units and diminished motor unit recruitment. All remaining muscles (as indicated in the following tables) were within normal limits.      Conclusion: There are chronic neurogenic changes in muscles that share L5/S1 innervation c/w remote lumbar radiculopathy. Given current symptoms suspect superficial peroneal neuropathy. Pending MRi lumbar spine.      ------------------------------- Onetha Epp, M.D.  [facility] [address], [address] [place], [place] [address] Tel: [phone] Fax: [phone]  Verbal informed consent was obtained from the patient, patient was informed of  potential risk of procedure, including bruising, bleeding, hematoma formation, infection, muscle weakness, muscle pain, numbness, among others.        MNC    Nerve / Sites Muscle Latency Ref. Amplitude Ref. Rel Amp Segments Distance Velocity Ref. Area    ms ms mV mV %  cm m/s m/s mVms  L Peroneal - EDB     Ankle EDB 6.2 <=6.5 2.2 >=2.0 100 Ankle - EDB 9   6.1     Fib head EDB 12.6  2.6  116 Fib head - Ankle 28 44 >=44 8.5     Pop fossa EDB 14.8  3.0  119 Pop fossa - Fib head 10 46 >=44 12.5         Pop fossa - Ankle      R Peroneal - EDB     Ankle EDB 3.9 <=6.5 2.9 >=2.0 100 Ankle - EDB 9   7.9     Fib head EDB 10.3  2.4  81 Fib head - Ankle 29 46 >=44 7.4     Pop fossa EDB 12.4  2.2  92 Pop fossa - Fib head 10.6 51 >=44 7.5         Pop fossa - Ankle      L Tibial - AH     Ankle AH 4.7 <=5.8 6.4 >=4.0 100 Ankle - AH 9   18.7     Pop fossa AH 14.4  4.8  74.8 Pop fossa - Ankle 40 41 >=41 15.8  R Tibial - AH     Ankle AH 4.3 <=5.8 4.3 >=4.0 100 Ankle - AH 9   15.2     Pop fossa AH 14.1  3.6  83.3 Pop  fossa - Ankle 40 41 >=41 15.6  L Peroneal - Tib Ant     Fib Head Tib Ant 3.3 <=4.7 3.4 >=3.0 100 Fib Head - Tib Ant 10   6.8     Pop fossa Tib Ant 4.8  2.5  71.9 Pop fossa - Fib Head 9 58 >=44 4.0               SNC    Nerve / Sites Rec. Site Peak Lat Ref.  Amp Ref. Segments Distance    ms ms V V  cm  L Sural - Ankle (Calf)     Calf Ankle 3.5 <=4.4 8 >=6 Calf - Ankle 14  R Sural - Ankle (Calf)     Calf Ankle 4.4 <=4.4 10 >=6 Calf - Ankle 14  L Superficial peroneal - Ankle     Lat leg Ankle 4.3 <=4.4 9 >=6 Lat leg - Ankle 14  R Superficial peroneal - Ankle     Lat leg Ankle 3.9 <=4.4 10 >=6 Lat leg - Ankle 14             F  Wave    Nerve F Lat Ref.   ms ms  L Tibial - AH 51.5 <=56.0  R Tibial - AH 49.5 <=56.0         EMG Summary Table    Spontaneous MUAP Recruitment  Muscle IA Fib PSW Fasc Other Amp Dur. Poly Pattern  L. Vastus medialis  None None None _______ Normal  Normal Normal Normal  L. Tibialis anterior Normal None None None _______ Normal Normal 1+ Reduced  L. Tibialis posterior Normal None None None _______ Normal Increased Normal Reduced  L. Peroneus longus Normal None None None _______ Normal Increased 4+ Reduced  L. Gluteus maximus Normal None None None _______ Normal Normal Normal Normal  L. Gluteus medius Normal None None None _______ Normal Normal Normal Normal  L. Gastrocnemius (Medial head) Normal None None None _______ Increased Increased 2+ Reduced  L. Biceps femoris (long head) Normal None None None _______ Normal Normal Normal Normal  L. Biceps femoris (short head) Normal None None None _______ Normal Normal Normal Normal  L. Extensor hallucis longus Normal None None None _______ Normal Increased 4+ Single

## 2024-01-06 ENCOUNTER — Other Ambulatory Visit: Payer: Medicare Other

## 2024-01-22 ENCOUNTER — Ambulatory Visit
Admission: RE | Admit: 2024-01-22 | Discharge: 2024-01-22 | Disposition: A | Discharge status: Home / Self Care | Payer: Medicare Other | Source: Ambulatory Visit | Attending: Neurology

## 2024-01-22 DIAGNOSIS — R200 Anesthesia of skin: Secondary | ICD-10-CM

## 2024-01-22 DIAGNOSIS — M5417 Radiculopathy, lumbosacral region: Secondary | ICD-10-CM | POA: Diagnosis not present

## 2024-01-22 DIAGNOSIS — R29898 Other symptoms and signs involving the musculoskeletal system: Secondary | ICD-10-CM

## 2024-01-23 ENCOUNTER — Telehealth: Payer: Self-pay | Admitting: Neurology

## 2024-01-23 ENCOUNTER — Encounter: Payer: Self-pay | Admitting: Neurology

## 2024-01-23 DIAGNOSIS — M5416 Radiculopathy, lumbar region: Secondary | ICD-10-CM

## 2024-01-23 NOTE — Telephone Encounter (Signed)
 Pod 4: can you call him and see if he is willing to be seen by someone for his low back pinched nerve( FYI: Hi Kurt Wagner, it does looke like you have some low back issues that can be causing your foot numbness. May I send you to an neurosurgeon to evaluate for injections or surgery? I know it is a drive but [facility] has a great one in [place] or maybe you have seen one in the past? I'll ask my nurse to call you thanks! )   FYI dr. Sedalia Muta:     IMPRESSION: This MRI of the lumbar spine without contrast shows the following: At L2-L3, there are degenerative changes more to the left causing moderate left lateral recess stenosis.  There does not appear to be spinal stenosis or nerve root compression. At L3-L4, there are degenerative changes causing moderate left foraminal narrowing and moderate left lateral recess stenosis though there does not appear to be spinal stenosis or nerve root compression. At L4-L5, there is a right paramedian disc protrusion and other degenerative change causing moderately severe right lateral recess stenosis and moderate right foraminal.  There is potential for right L5 nerve root compression. At L5-S1, there is severe loss of disc height and other degenerative changes causing mild to moderate foraminal and left lateral recess stenosis.  There does not appear to be spinal stenosis or nerve root compression.

## 2024-01-23 NOTE — Telephone Encounter (Signed)
 Called pt & LVM asking for call back to discuss results and next steps. Left office number and hours in message. We will try to reach him tomorrow again as well.

## 2024-01-24 NOTE — Addendum Note (Signed)
 Addended by: Bertram Savin on: 01/24/2024 01:45 PM   Modules accepted: Orders

## 2024-01-24 NOTE — Telephone Encounter (Signed)
 I spoke with the patient's wife Liborio Nixon (on [place]) and discussed MRI results as noted below by Dr Lucia Gaskins. The pt's wife said they would like to proceed with neurosurgery referral. She would like the referral to go to one of the [facility] surgeons in [place]. She was appreciative for the call and will be on the lookout for a call within approximately 2 weeks.   Referral placed

## 2024-02-06 NOTE — Progress Notes (Unsigned)
 Referring Physician:  Anson Fret, MD [address] [place],  [place] [address]  Primary Physician:  Blane Ohara, MD  History of Present Illness: 02/08/2024 [name] has a history of HTN, CAD, PSVT, hyperlipidemia, prediabetes, STEMI, cardiac stent.  History of lumbar surgery x 3. Last was in 2004 and he did well.   He has intermittent LBP that is worse in the morning. Primary complaint is constant numbness left lateral leg from knee into foot for over a year. Has some discomfort in left leg, but no pain. He feels some weakness in left leg. No right leg pain. No aggravating factors. Some improvement with walking/moving.   He is not taking any medications for his back.   He smokes 1/2 PPD x 49 years.   Bowel/Bladder Dysfunction: none  Conservative measures:  Physical therapy: has not participated in PT Multimodal medical therapy including regular antiinflammatories: none Injections: Had lumbar ESIs years ago, BP bottomed out after injection.   Past Surgery:  Lumbar disc surgery x 3   ELON EOFF has no symptoms of cervical myelopathy.  The symptoms are causing a significant impact on the patient's life.   Review of Systems:  A 10 point review of systems is negative, except for the pertinent positives and negatives detailed in the HPI.  Past Medical History: Past Medical History:  Diagnosis Date   Ectopic atrial rhythm 12/26/2019   Essential (primary) hypertension    Essential hypertension 12/26/2019   Gastro-esophageal reflux disease with esophagitis    Intervertebral disc disorders with myelopathy of lumbar region    Lipoma of back 06/24/2016   Mixed hyperlipidemia    Nicotine dependence unspecified, with withdrawal    Precordial pain 12/26/2019   Status post coronary artery stent placement 05/12/2022   STEMI involving left anterior descending coronary artery (HCC) 05/12/2022   Formatting of this note might be different from the original.   DES to LAD and POBA to diagonal by Dr. Reuel Boom.   Tobacco use 12/26/2019    Past Surgical History: Past Surgical History:  Procedure Laterality Date   APPENDECTOMY     CHEST SURGERY     Tumor inside chest   CORONARY STENT PLACEMENT  05/12/2022   Xience Skypoint 3.0 mm x 33 mmCSN: [id number] UDI-[id number] REF: [id number], LOT [id number]   HERNIA REPAIR     LUMBAR DISC SURGERY     X3   TUMOR EXCISION     From back    Allergies: Allergies as of 02/08/2024 - Review Complete 02/08/2024  Allergen Reaction Noted   Azithromycin Other (See Comments) 05/12/2022   Fentanyl Other (See Comments) 05/12/2022   Simvastatin Other (See Comments) 05/12/2022   Tape Other (See Comments) 12/26/2019   Varenicline Other (See Comments) 05/12/2022   Wound dressing adhesive Other (See Comments) 12/26/2019    Medications: Outpatient Encounter Medications as of 02/08/2024  Medication Sig   ASPIRIN 81 PO Take 81 mg by mouth daily.   atorvastatin (LIPITOR) 80 MG tablet Take 80 mg by mouth daily.   diltiazem (CARDIZEM CD) 240 MG 24 hr capsule Take 1 capsule (240 mg total) by mouth daily.   diphenhydrAMINE (BENADRYL) 25 mg capsule Take 25 mg by mouth 2 (two) times daily.   ezetimibe (ZETIA) 10 MG tablet Take 1 tablet (10 mg total) by mouth daily.   meclizine (ANTIVERT) 25 MG tablet Take 1 tablet (25 mg total) by mouth 3 (three) times daily as needed for dizziness.   nitroGLYCERIN (NITROSTAT) 0.4 MG  SL tablet Place 1 tablet (0.4 mg total) under the tongue every 5 (five) minutes as needed for chest pain.   omeprazole (PRILOSEC) 20 MG capsule Take 20 mg by mouth daily.   topiramate (TOPAMAX) 50 MG tablet Take 1 tablet (50 mg total) by mouth 2 (two) times daily.   [DISCONTINUED] pantoprazole (PROTONIX) 40 MG tablet Take 1 tablet by mouth daily.   No facility-administered encounter medications on file as of 02/08/2024.    Social History: Social History   Tobacco Use   Smoking status: Every Day     Current packs/day: 0.50    Average packs/day: 0.5 packs/day for 49.0 years (24.5 ttl pk-yrs)    Types: Cigarettes   Smokeless tobacco: Former  Building services engineer status: Former  Substance Use Topics   Alcohol use: Not Currently    Alcohol/week: 12.0 standard drinks of alcohol    Types: 12 Cans of beer per week   Drug use: Never    Family Medical History: Family History  Problem Relation Age of Onset   Hypertension Mother    Arthritis Mother    Diabetes Mother    Atrial fibrillation Father     Physical Examination: Vitals:   02/08/24 1304  BP: 130/88    General: Patient is well developed, well nourished, calm, collected, and in no apparent distress. Attention to examination is appropriate.  Respiratory: Patient is breathing without any difficulty.   NEUROLOGICAL:     Awake, alert, oriented to person, place, and time.  Speech is clear and fluent. Fund of knowledge is appropriate.   Cranial Nerves: Pupils equal round and reactive to light.  Facial tone is symmetric.    Well healed lumbar incision. No tenderness.  No abnormal lesions on exposed skin.   Wagner: Side Biceps Triceps Deltoid Interossei Grip Wrist Ext. Wrist Flex.  R 5 5 5 5 5 5 5   L 5 5 5 5 5 5 5    Side Iliopsoas Quads Hamstring PF DF EHL  R 5 5 5 5 5 5   L 5 5 5 5 5  4-   Reflexes are 2+ and symmetric at the biceps, brachioradialis, patella and achilles.   Hoffman's is absent.  Clonus is not present.   Bilateral upper and lower extremity sensation is intact to light touch, but diminished in left lateral calf into last 3 toes.   Negative tinels over peroneal nerve on left.   Gait is normal.     Dr. Katrinka Blazing was in to examine the patient.   Medical Decision Making  Imaging: MRI lumbar spine dated 01/22/24:  FINDINGS: On sagittal images, the spine is imaged from T11 to the sacrum.   The conus medullaris and cauda equine appear normal.   The vertebral bodies are normally aligned.   Endplate  degenerative changes and edema are noted at L4-L5 and Modic type II endplate degenerative changes are noted at L5-S1 associated with severe loss of disc height.   The discs and interspaces were further evaluated on axial views from L1 to S1 as follows:   T12-L1: This level appears normal.   L1-L2: There is negligible disc bulging.  The level is otherwise normal and there is no spinal stenosis or nerve root compression.   L2-L3: There is a small disc protrusion towards the left.  This causes mild left foraminal narrowing and moderate left lateral recess stenosis and mild right lateral recess stenosis.  There is no nerve root compression or spinal stenosis.   L3-L4: There is  disc protrusion and mild endplate spurring and mild facet hypertrophy combining to cause moderate left foraminal narrowing, mild right foraminal narrowing, moderate left lateral recess stenosis and mild to moderate right lateral recess stenosis.  There does not appear to be nerve root compression or spinal stenosis.   L4-L5: There is a right paramedian disc protrusion and endplate spurring causing moderate right foraminal narrowing, moderately severe right lateral recess stenosis, mild left foraminal narrowing mild left lateral recess stenosis.  There is potential for right L5 nerve root compression.  There does not appear to be nerve root compression on the left.   L5-S1: There is reduced disc height, endplate spurring and facet hypertrophy causing mild to moderate foraminal narrowing and mild to moderate left lateral recess stenosis though there does not appear to be any spinal stenosis or nerve root compression.     IMPRESSION: This MRI of the lumbar spine without contrast shows the following: At L2-L3, there are degenerative changes more to the left causing moderate left lateral recess stenosis.  There does not appear to be spinal stenosis or nerve root compression. At L3-L4, there are degenerative changes causing moderate  left foraminal narrowing and moderate left lateral recess stenosis though there does not appear to be spinal stenosis or nerve root compression. At L4-L5, there is a right paramedian disc protrusion and other degenerative change causing moderately severe right lateral recess stenosis and moderate right foraminal.  There is potential for right L5 nerve root compression. At L5-S1, there is severe loss of disc height and other degenerative changes causing mild to moderate foraminal and left lateral recess stenosis.  There does not appear to be spinal stenosis or nerve root compression.       INTERPRETING PHYSICIAN:  Richard A. Epimenio Foot, MD, PhD, FAAN Certified in  Neuroimaging by [employer]  I have personally reviewed the images and agree with the above interpretation.  EMG of bilateral lower extremities dated 01/04/24:  Conclusion: There are chronic neurogenic changes in muscles that share L5/S1 innervation c/w remote lumbar radiculopathy. Given current symptoms suspect superficial peroneal neuropathy. Pending MRi lumbar spine.          ------------------------------- Naomie Dean, M.D.    Above EMG and lumbar MRI reviewed with Dr. Katrinka Blazing.   Assessment and Plan: Mr. Pasqual has a history of lumbar surgery x 3. Last was in 2004 and he did well.   He has intermittent LBP that is worse in the morning. Primary complaint is constant numbness left lateral leg from knee into foot for over a year. Has some discomfort in left leg, but no pain. He feels some weakness in left leg. No right leg pain.   He has known lumbar spondylosis with multilevel foraminal stenosis and right sided disc L4-L5. This would not explain symptoms on left leg.   Symptoms and exam are consistent with mild left peroneal neuropathy. He has very mild weakness left EHL.   Treatment options discussed with patient and following plan made:   - Dr. Katrinka Blazing discussed surgery options with him. Discussed that  surgery would not improve numbness which is his primary complaint.  - Would revisit surgery if his weakness gets worse or he develops pain or tingling.  - He will f/u prn.   I spent a total of 35 minutes in face-to-face and non-face-to-face activities related to this patient's care today including review of outside records, review of imaging, review of symptoms, physical exam, discussion of differential diagnosis, discussion of treatment options, and  documentation.   Thank you for involving me in the care of this patient.   Drake Leach PA-C Dept. of Neurosurgery

## 2024-02-08 ENCOUNTER — Encounter: Payer: Self-pay | Admitting: Orthopedic Surgery

## 2024-02-08 ENCOUNTER — Ambulatory Visit: Payer: Medicare Other | Admitting: Orthopedic Surgery

## 2024-02-08 VITALS — BP 130/88 | Ht 70.0 in | Wt 154.0 lb

## 2024-02-08 DIAGNOSIS — G5732 Lesion of lateral popliteal nerve, left lower limb: Secondary | ICD-10-CM | POA: Diagnosis not present

## 2024-02-08 DIAGNOSIS — M47816 Spondylosis without myelopathy or radiculopathy, lumbar region: Secondary | ICD-10-CM

## 2024-02-08 DIAGNOSIS — M5126 Other intervertebral disc displacement, lumbar region: Secondary | ICD-10-CM

## 2024-02-08 DIAGNOSIS — M48061 Spinal stenosis, lumbar region without neurogenic claudication: Secondary | ICD-10-CM

## 2024-02-22 DIAGNOSIS — Z79899 Other long term (current) drug therapy: Secondary | ICD-10-CM | POA: Diagnosis not present

## 2024-02-22 DIAGNOSIS — E785 Hyperlipidemia, unspecified: Secondary | ICD-10-CM | POA: Diagnosis not present

## 2024-02-23 LAB — LIPID PANEL
Chol/HDL Ratio: 3.1 ratio (ref 0.0–5.0)
Cholesterol, Total: 113 mg/dL (ref 100–199)
HDL: 36 mg/dL — ABNORMAL LOW (ref >39)
LDL Chol Calc (NIH): 60 mg/dL (ref 0–99)
Triglycerides: 88 mg/dL (ref 0–149)
VLDL Cholesterol Cal: 17 mg/dL (ref 5–40)

## 2024-02-28 ENCOUNTER — Encounter: Payer: Self-pay | Admitting: Cardiology

## 2024-04-27 ENCOUNTER — Ambulatory Visit: Payer: Medicare Other | Attending: Cardiology | Admitting: Cardiology

## 2024-04-27 ENCOUNTER — Encounter: Payer: Self-pay | Admitting: Cardiology

## 2024-04-27 ENCOUNTER — Ambulatory Visit

## 2024-04-27 VITALS — BP 132/84 | HR 69 | Ht 70.0 in | Wt 150.0 lb

## 2024-04-27 DIAGNOSIS — I493 Ventricular premature depolarization: Secondary | ICD-10-CM | POA: Insufficient documentation

## 2024-04-27 DIAGNOSIS — I2510 Atherosclerotic heart disease of native coronary artery without angina pectoris: Secondary | ICD-10-CM | POA: Diagnosis not present

## 2024-04-27 DIAGNOSIS — R7303 Prediabetes: Secondary | ICD-10-CM | POA: Insufficient documentation

## 2024-04-27 DIAGNOSIS — Z79899 Other long term (current) drug therapy: Secondary | ICD-10-CM | POA: Diagnosis not present

## 2024-04-27 DIAGNOSIS — I4729 Other ventricular tachycardia: Secondary | ICD-10-CM | POA: Diagnosis not present

## 2024-04-27 DIAGNOSIS — I10 Essential (primary) hypertension: Secondary | ICD-10-CM | POA: Insufficient documentation

## 2024-04-27 DIAGNOSIS — I491 Atrial premature depolarization: Secondary | ICD-10-CM

## 2024-04-27 DIAGNOSIS — E785 Hyperlipidemia, unspecified: Secondary | ICD-10-CM | POA: Diagnosis not present

## 2024-04-27 MED ORDER — EZETIMIBE 10 MG PO TABS: 10.0000 mg | ORAL_TABLET | Freq: Every day | ORAL | 3 refills | Status: AC

## 2024-04-27 MED ORDER — NITROGLYCERIN 0.4 MG SL SUBL
0.4000 mg | SUBLINGUAL_TABLET | SUBLINGUAL | 6 refills | Status: AC | PRN
Start: 2024-04-27 — End: ?

## 2024-04-27 MED ORDER — REPATHA SURECLICK 140 MG/ML ~~LOC~~ SOAJ: 140.0000 mg | SUBCUTANEOUS | 2 refills | Status: DC

## 2024-04-27 MED ORDER — DILTIAZEM HCL ER COATED BEADS 240 MG PO CP24
240.0000 mg | ORAL_CAPSULE | Freq: Every day | ORAL | 3 refills | Status: AC
Start: 2024-04-27 — End: 2024-08-29

## 2024-04-27 NOTE — Patient Instructions (Addendum)
 Medication Instructions:  Your physician has recommended you make the following change in your medication:  STOP: Atorvastatin START: Repatha 140 mg/ml inject into skin once every 14 days. *If you need a refill on your cardiac medications before your next appointment, please call your pharmacy*  Lab Work: CMET, Mag, HgbA1c If you have labs (blood work) drawn today and your tests are completely normal, you will receive your results only by: MyChart Message (if you have MyChart) OR A paper copy in the mail If you have any lab test that is abnormal or we need to change your treatment, we will call you to review the results.  Testing/Procedures: Delane Fear- Long Term Monitor Instructions  Your physician has requested you wear a ZIO patch monitor for 14 days.  This is a single patch monitor. [employer] supplies one patch monitor per enrollment. Additional stickers are not available. Please do not apply patch if you will be having a Nuclear Stress Test,  Echocardiogram, Cardiac CT, MRI, or Chest Xray during the period you would be wearing the  monitor. The patch cannot be worn during these tests. You cannot remove and re-apply the  ZIO XT patch monitor.  Your ZIO patch monitor will be mailed 3 day USPS to your address on file. It may take 3-5 days  to receive your monitor after you have been enrolled.  Once you have received your monitor, please review the enclosed instructions. Your monitor  has already been registered assigning a specific monitor serial # to you.  Billing and Patient Assistance Program Information  We have supplied [employer] with any of your insurance information on file for billing purposes. [employer] offers a sliding scale Patient Assistance Program for patients that do not have  insurance, or whose insurance does not completely cover the cost of the ZIO monitor.  You must apply for the Patient Assistance Program to qualify for this discounted rate.  To apply, please call [employer]  at [phone], select option 4, select option 2, ask to apply for  Patient Assistance Program. Sanna Crystal will ask your household income, and how many people  are in your household. They will quote your out-of-pocket cost based on that information.  [employer] will also be able to set up a 85-month, interest-free payment plan if needed.  Applying the monitor   Shave hair from upper left chest.  Hold abrader disc by orange tab. Rub abrader in 40 strokes over the upper left chest as  indicated in your monitor instructions.  Clean area with 4 enclosed alcohol pads. Let dry.  Apply patch as indicated in monitor instructions. Patch will be placed under collarbone on left  side of chest with arrow pointing upward.  Rub patch adhesive wings for 2 minutes. Remove white label marked "1". Remove the white  label marked "2". Rub patch adhesive wings for 2 additional minutes.  While looking in a mirror, press and release button in center of patch. A small green light will  flash 3-4 times. This will be your only indicator that the monitor has been turned on.  Do not shower for the first 24 hours. You may shower after the first 24 hours.  Press the button if you feel a symptom. You will hear a small click. Record Date, Time and  Symptom in the Patient Logbook.  When you are ready to remove the patch, follow instructions on the last 2 pages of Patient  Logbook. Stick patch monitor onto the last page of Patient Logbook.  Place Patient  Logbook in the blue and white box. Use locking tab on box and tape box closed  securely. The blue and white box has prepaid postage on it. Please place it in the mailbox as  soon as possible. Your physician should have your test results approximately 7 days after the  monitor has been mailed back to [facility].  Call [facility] Customer Care at [phone] if you have questions regarding  your ZIO XT patch monitor. Call them immediately if you see an orange light  blinking on your  monitor.  If your monitor falls off in less than 4 days, contact our Monitor department at [phone].  If your monitor becomes loose or falls off after 4 days call [employer] at [phone] for  suggestions on securing your monitor   Follow-Up: At [facility], you and your health needs are our priority.  As part of our continuing mission to provide you with exceptional heart care, our providers are all part of one team.  This team includes your primary Cardiologist (physician) and Advanced Practice Providers or APPs (Physician Assistants and Nurse Practitioners) who all work together to provide you with the care you need, when you need it.  Your next appointment:   9 month(s)  Provider:   Kardie Tobb, DO    Other Instructions Please set an appt with Pharm-D

## 2024-04-27 NOTE — Progress Notes (Unsigned)
 Enrolled patient for a 14 day Zio XT  monitor to be mailed to patients home

## 2024-04-30 DIAGNOSIS — Z79899 Other long term (current) drug therapy: Secondary | ICD-10-CM | POA: Diagnosis not present

## 2024-04-30 DIAGNOSIS — R7303 Prediabetes: Secondary | ICD-10-CM | POA: Diagnosis not present

## 2024-05-01 LAB — COMPREHENSIVE METABOLIC PANEL WITH GFR
ALT: 17 IU/L (ref 0–44)
AST: 14 IU/L (ref 0–40)
Albumin: 4.7 g/dL (ref 3.9–4.9)
Alkaline Phosphatase: 108 IU/L (ref 44–121)
BUN/Creatinine Ratio: 9 — ABNORMAL LOW (ref 10–24)
BUN: 10 mg/dL (ref 8–27)
Bilirubin Total: 0.2 mg/dL (ref 0.0–1.2)
CO2: 19 mmol/L — ABNORMAL LOW (ref 20–29)
Calcium: 9.3 mg/dL (ref 8.6–10.2)
Chloride: 105 mmol/L (ref 96–106)
Creatinine, Ser: 1.09 mg/dL (ref 0.76–1.27)
Globulin, Total: 2.5 g/dL (ref 1.5–4.5)
Glucose: 106 mg/dL — ABNORMAL HIGH (ref 70–99)
Potassium: 4.4 mmol/L (ref 3.5–5.2)
Sodium: 141 mmol/L (ref 134–144)
Total Protein: 7.2 g/dL (ref 6.0–8.5)
eGFR: 77 mL/min/{1.73_m2} (ref >59)

## 2024-05-01 LAB — HEMOGLOBIN A1C
Est. average glucose Bld gHb Est-mCnc: 117 mg/dL
Hgb A1c MFr Bld: 5.7 % — ABNORMAL HIGH (ref 4.8–5.6)

## 2024-05-01 LAB — MAGNESIUM: Magnesium: 2.3 mg/dL (ref 1.6–2.3)

## 2024-05-04 ENCOUNTER — Encounter: Payer: Self-pay | Admitting: Cardiology

## 2024-05-05 NOTE — Progress Notes (Signed)
 Cardiology Office Note:    Date:  05/05/2024   ID:  Kurt Wagner, DOB 04-10-1961, MRN [id number]  PCP:  Mercy Stall, MD  Cardiologist:  Jerryl Morin, DO  Electrophysiologist:  None   Referring MD: Mercy Stall, MD     History of Present Illness:    Kurt Wagner is a 63 y.o. male with a hx of STEMI, DES, coronary artery disease, hyperlipidemia, hypertension,ventricular tachycardia.   Discussed the use of AI scribe software for clinical note transcription with the patient, who gave verbal consent to proceed  He experiences significant joint pain, which he attributes to Lipitor. He was previously on simvastatin before switching to Lipitor. He inquired about increasing his Zetia  dosage, but he is already on the maximum dose.  He has been running low on nitroglycerin , although he does not use it daily. He has taken two doses in the past six months, with the last use during a family game night in February due to chest pain. He prefers having nitroglycerin  available as a backup.  His wife reports low blood pressure in the evenings, with readings around 90/60 mmHg, leading to weakness and the need to lie down. This has been more frequent since increased stress levels. Prior to this, his blood pressure was stable.   Past Medical History:  Diagnosis Date   Ectopic atrial rhythm 12/26/2019   Essential (primary) hypertension    Essential hypertension 12/26/2019   Gastro-esophageal reflux disease with esophagitis    Intervertebral disc disorders with myelopathy of lumbar region    Lipoma of back 06/24/2016   Mixed hyperlipidemia    Nicotine dependence unspecified, with withdrawal    Precordial pain 12/26/2019   Status post coronary artery stent placement 05/12/2022   STEMI involving left anterior descending coronary artery (HCC) 05/12/2022   Formatting of this note might be different from the original.  DES to LAD and POBA to diagonal by Dr. Bearl Limes.   Tobacco use 12/26/2019     Past Surgical History:  Procedure Laterality Date   APPENDECTOMY     CHEST SURGERY     Tumor inside chest   CORONARY STENT PLACEMENT  05/12/2022   Xience Skypoint 3.0 mm x 33 mmCSN: [id number] UDI-[id number] REF: [id number], LOT [id number]   HERNIA REPAIR     LUMBAR DISC SURGERY     X3   TUMOR EXCISION     From back    Current Medications: Current Meds  Medication Sig   ASPIRIN 81 PO Take 81 mg by mouth daily.   diphenhydrAMINE  (BENADRYL ) 25 mg capsule Take 25 mg by mouth 2 (two) times daily.   Evolocumab  (REPATHA  SURECLICK) 140 MG/ML SOAJ Inject 140 mg into the skin every 14 (fourteen) days.   meclizine  (ANTIVERT ) 25 MG tablet Take 1 tablet (25 mg total) by mouth 3 (three) times daily as needed for dizziness.   omeprazole (PRILOSEC) 20 MG capsule Take 20 mg by mouth daily.   topiramate  (TOPAMAX ) 50 MG tablet Take 1 tablet (50 mg total) by mouth 2 (two) times daily.   [DISCONTINUED] atorvastatin (LIPITOR) 80 MG tablet Take 80 mg by mouth daily.   [DISCONTINUED] nitroGLYCERIN  (NITROSTAT ) 0.4 MG SL tablet Place 1 tablet (0.4 mg total) under the tongue every 5 (five) minutes as needed for chest pain.     Allergies:   Azithromycin, Fentanyl , Simvastatin, Tape, Varenicline, and Wound dressing adhesive   Social History   Socioeconomic History   Marital status: Married    Spouse name: Not on file  Number of children: 2   Years of education: Not on file   Highest education level: Not on file  Occupational History   Not on file  Tobacco Use   Smoking status: Every Day    Current packs/day: 0.50    Average packs/day: 0.5 packs/day for 49.0 years (24.5 ttl pk-yrs)    Types: Cigarettes   Smokeless tobacco: Former  Building services engineer status: Former  Substance and Sexual Activity   Alcohol use: Not Currently    Alcohol/week: 12.0 standard drinks of alcohol    Types: 12 Cans of beer per week   Drug use: Never   Sexual activity: Yes    Partners: Female  Other  Topics Concern   Not on file  Social History Narrative   Not on file   Social Drivers of Health   Financial Resource Strain: Low Risk  (05/18/2023)   Overall Financial Resource Strain (CARDIA)    Difficulty of Paying Living Expenses: Not hard at all  Food Insecurity: Low Risk  (10/18/2023)   Received from [facility]   Hunger Vital Sign    Worried About Running Out of Food in the Last Year: Never true    Ran Out of Food in the Last Year: Never true  Transportation Needs: No Transportation Needs (10/18/2023)   Received from [facility]    In the past 12 months, has lack of reliable transportation kept you from medical appointments, meetings, work or from getting things needed for daily living? : No  Physical Activity: Insufficiently Active (05/18/2023)   Exercise Vital Sign    Days of Exercise per Week: 3 days    Minutes of Exercise per Session: 30 min  Stress: No Stress Concern Present (05/18/2023)   [employer] of Occupational Health - Occupational Stress Questionnaire    Feeling of Stress : Not at all  Social Connections: Not on file     Family History: The patient's family history includes Arthritis in his mother; Atrial fibrillation in his father; Diabetes in his mother; Hypertension in his mother.  ROS:   Review of Systems  Constitution: Negative for decreased appetite, fever and weight gain.  HENT: Negative for congestion, ear discharge, hoarse voice and sore throat.   Eyes: Negative for discharge, redness, vision loss in right eye and visual halos.  Cardiovascular: Negative for chest pain, dyspnea on exertion, leg swelling, orthopnea and palpitations.  Respiratory: Negative for cough, hemoptysis, shortness of breath and snoring.   Endocrine: Negative for heat intolerance and polyphagia.  Hematologic/Lymphatic: Negative for bleeding problem. Does not bruise/bleed easily.  Skin: Negative for flushing, nail changes, rash and suspicious lesions.   Musculoskeletal: Negative for arthritis, joint pain, muscle cramps, myalgias, neck pain and stiffness.  Gastrointestinal: Negative for abdominal pain, bowel incontinence, diarrhea and excessive appetite.  Genitourinary: Negative for decreased libido, genital sores and incomplete emptying.  Neurological: Negative for brief paralysis, focal weakness, headaches and loss of balance.  Psychiatric/Behavioral: Negative for altered mental status, depression and suicidal ideas.  Allergic/Immunologic: Negative for HIV exposure and persistent infections.    EKGs/Labs/Other Studies Reviewed:    The following studies were reviewed today:   EKG:  The ekg ordered today demonstrates   Recent Labs: 04/30/2024: ALT 17; BUN 10; Creatinine, Ser 1.09; Magnesium  2.3; Potassium 4.4; Sodium 141  Recent Lipid Panel    Component Value Date/Time   CHOL 113 02/22/2024 1149   TRIG 88 02/22/2024 1149   HDL 36 (L) 02/22/2024 1149  CHOLHDL 3.1 02/22/2024 1149   LDLCALC 60 02/22/2024 1149    Physical Exam:    VS:  BP 132/84 (BP Location: Left Arm, Patient Position: Sitting, Cuff Size: Normal)   Pulse 69   Ht 5\' 10"  (1.778 m)   Wt 150 lb (68 kg)   SpO2 97%   BMI 21.52 kg/m     Wt Readings from Last 3 Encounters:  04/27/24 150 lb (68 kg)  02/08/24 154 lb (69.9 kg)  12/12/23 157 lb 1.3 oz (71.3 kg)     GEN: Well nourished, well developed in no acute distress HEENT: Normal NECK: No JVD; No carotid bruits LYMPHATICS: No lymphadenopathy CARDIAC: S1S2 noted,RRR, no murmurs, rubs, gallops RESPIRATORY:  Clear to auscultation without rales, wheezing or rhonchi  ABDOMEN: Soft, non-tender, non-distended, +bowel sounds, no guarding. EXTREMITIES: No edema, No cyanosis, no clubbing MUSCULOSKELETAL:  No deformity  SKIN: Warm and dry NEUROLOGIC:  Alert and oriented x 3, non-focal PSYCHIATRIC:  Normal affect, good insight  ASSESSMENT:    1. Primary hypertension   2. Coronary artery disease involving native  coronary artery of native heart without angina pectoris   3. Hyperlipidemia, unspecified hyperlipidemia type   4. PAC (premature atrial contraction)   5. Medication management   6. Prediabetes    PLAN:    NSVT Occasional PVCs PSVT Hyperlipidemia   Hyperlipidemia Last LDL was 76, which is above the target of <55 due to history of stents. Currently on Atorvastatin. -Order lipid panel to assess current LDL level. -he is experiencing statin induced joint pain- we will hold lipitor. Will start PCSK9 inhibitors. - Initiate PCSK9 inhibitors after consultation with lipid clinic.  CAD -no angina will continue current medication regimen.   Adverse effect of Lipitor Joint pain due to Lipitor. Discontinued Lipitor. Consider PCSK9 inhibitors for LDL <55. - Stop Lipitor. - Refer to pharmacy lipid clinic for PCSK9 inhibitors. - Initiate PCSK9 inhibitors after consultation with lipid clinic.  Hypotension Evening hypotension with weakness, possibly stress-related. Risk of syncope if BP drops further. - Monitor blood pressure daily, morning and evening, for at least a week. - Send blood pressure readings via MyChart. - Consider splitting Cardizem  dosing based on blood pressure readings. - Order heart monitor for 14 days to assess cardiac function.  Prediabetes Diagnosed April 2024. Emphasized regular monitoring to prevent diabetes progression. - Order kidney function test. - Order annual screening for prediabetes.   The patient is in agreement with the above plan. The patient left the office in stable condition.  The patient will follow up in   Medication Adjustments/Labs and Tests Ordered: Current medicines are reviewed at length with the patient today.  Concerns regarding medicines are outlined above.  Orders Placed This Encounter  Procedures   Comp Met (CMET)   Magnesium    Hemoglobin A1c   AMB Referral to [facility] Pharm-D   LONG TERM MONITOR (3-14 DAYS)   EKG 12-Lead   Meds  ordered this encounter  Medications   diltiazem  (CARDIZEM  CD) 240 MG 24 hr capsule    Sig: Take 1 capsule (240 mg total) by mouth daily.    Dispense:  90 capsule    Refill:  3   ezetimibe  (ZETIA ) 10 MG tablet    Sig: Take 1 tablet (10 mg total) by mouth daily.    Dispense:  90 tablet    Refill:  3   nitroGLYCERIN  (NITROSTAT ) 0.4 MG SL tablet    Sig: Place 1 tablet (0.4 mg total) under the tongue every 5 (five)  minutes as needed for chest pain.    Dispense:  25 tablet    Refill:  6   Evolocumab  (REPATHA  SURECLICK) 140 MG/ML SOAJ    Sig: Inject 140 mg into the skin every 14 (fourteen) days.    Dispense:  2 mL    Refill:  2    Patient Instructions  Medication Instructions:  Your physician has recommended you make the following change in your medication:  STOP: Atorvastatin START: Repatha  140 mg/ml inject into skin once every 14 days. *If you need a refill on your cardiac medications before your next appointment, please call your pharmacy*  Lab Work: CMET, Mag, HgbA1c If you have labs (blood work) drawn today and your tests are completely normal, you will receive your results only by: MyChart Message (if you have MyChart) OR A paper copy in the mail If you have any lab test that is abnormal or we need to change your treatment, we will call you to review the results.  Testing/Procedures: Delane Fear- Long Term Monitor Instructions  Your physician has requested you wear a ZIO patch monitor for 14 days.  This is a single patch monitor. [employer] supplies one patch monitor per enrollment. Additional stickers are not available. Please do not apply patch if you will be having a Nuclear Stress Test,  Echocardiogram, Cardiac CT, MRI, or Chest Xray during the period you would be wearing the  monitor. The patch cannot be worn during these tests. You cannot remove and re-apply the  ZIO XT patch monitor.  Your ZIO patch monitor will be mailed 3 day USPS to your address on file. It may take 3-5  days  to receive your monitor after you have been enrolled.  Once you have received your monitor, please review the enclosed instructions. Your monitor  has already been registered assigning a specific monitor serial # to you.  Billing and Patient Assistance Program Information  We have supplied [employer] with any of your insurance information on file for billing purposes. [employer] offers a sliding scale Patient Assistance Program for patients that do not have  insurance, or whose insurance does not completely cover the cost of the ZIO monitor.  You must apply for the Patient Assistance Program to qualify for this discounted rate.  To apply, please call [employer] at [phone], select option 4, select option 2, ask to apply for  Patient Assistance Program. Sanna Crystal will ask your household income, and how many people  are in your household. They will quote your out-of-pocket cost based on that information.  [employer] will also be able to set up a 63-month, interest-free payment plan if needed.  Applying the monitor   Shave hair from upper left chest.  Hold abrader disc by orange tab. Rub abrader in 40 strokes over the upper left chest as  indicated in your monitor instructions.  Clean area with 4 enclosed alcohol pads. Let dry.  Apply patch as indicated in monitor instructions. Patch will be placed under collarbone on left  side of chest with arrow pointing upward.  Rub patch adhesive wings for 2 minutes. Remove white label marked "1". Remove the white  label marked "2". Rub patch adhesive wings for 2 additional minutes.  While looking in a mirror, press and release button in center of patch. A small green light will  flash 3-4 times. This will be your only indicator that the monitor has been turned on.  Do not shower for the first 24 hours. You may shower after the first  24 hours.  Press the button if you feel a symptom. You will hear a small click. Record Date, Time and  Symptom in the  Patient Logbook.  When you are ready to remove the patch, follow instructions on the last 2 pages of Patient  Logbook. Stick patch monitor onto the last page of Patient Logbook.  Place Patient Logbook in the blue and white box. Use locking tab on box and tape box closed  securely. The blue and white box has prepaid postage on it. Please place it in the mailbox as  soon as possible. Your physician should have your test results approximately 7 days after the  monitor has been mailed back to [facility].  Call [facility] Customer Care at [phone] if you have questions regarding  your ZIO XT patch monitor. Call them immediately if you see an orange light blinking on your  monitor.  If your monitor falls off in less than 4 days, contact our Monitor department at [phone].  If your monitor becomes loose or falls off after 4 days call [employer] at [phone] for  suggestions on securing your monitor   Follow-Up: At [facility], you and your health needs are our priority.  As part of our continuing mission to provide you with exceptional heart care, our providers are all part of one team.  This team includes your primary Cardiologist (physician) and Advanced Practice Providers or APPs (Physician Assistants and Nurse Practitioners) who all work together to provide you with the care you need, when you need it.  Your next appointment:   9 month(s)  Provider:   Laloni Rowton, DO    Other Instructions Please set an appt with Pharm-D       Adopting a Healthy Lifestyle.  Know what a healthy weight is for you (roughly BMI <25) and aim to maintain this   Aim for 7+ servings of fruits and vegetables daily   65-80+ fluid ounces of water or unsweet tea for healthy kidneys   Limit to max 1 drink of alcohol per day; avoid smoking/tobacco   Limit animal fats in diet for cholesterol and heart health - choose grass fed whenever available   Avoid highly processed foods, and  foods high in saturated/trans fats   Aim for low stress - take time to unwind and care for your mental health   Aim for 150 min of moderate intensity exercise weekly for heart health, and weights twice weekly for bone health   Aim for 7-9 hours of sleep daily   When it comes to diets, agreement about the perfect plan isnt easy to find, even among the experts. Experts at the [facility] [employer] developed an idea known as the Healthy Eating Plate. Just imagine a plate divided into logical, healthy portions.   The emphasis is on diet quality:   Load up on vegetables and fruits - one-half of your plate: Aim for color and variety, and remember that potatoes dont count.   Go for whole grains - one-quarter of your plate: Whole wheat, barley, wheat berries, quinoa, oats, brown rice, and foods made with them. If you want pasta, go with whole wheat pasta.   Protein power - one-quarter of your plate: Fish, chicken, beans, and nuts are all healthy, versatile protein sources. Limit red meat.   The diet, however, does go beyond the plate, offering a few other suggestions.   Use healthy plant oils, such as olive, canola, soy, corn, sunflower and peanut. Check the  labels, and avoid partially hydrogenated oil, which have unhealthy trans fats.   If youre thirsty, drink water. Coffee and tea are good in moderation, but skip sugary drinks and limit milk and dairy products to one or two daily servings.   The type of carbohydrate in the diet is more important than the amount. Some sources of carbohydrates, such as vegetables, fruits, whole grains, and beans-are healthier than others.   Finally, stay active  Signed, Bleu Moisan, DO  05/05/2024 11:30 PM    [facility]

## 2024-05-09 ENCOUNTER — Ambulatory Visit: Payer: Self-pay | Admitting: Cardiology

## 2024-05-15 ENCOUNTER — Other Ambulatory Visit (HOSPITAL_COMMUNITY): Payer: Self-pay

## 2024-05-22 DIAGNOSIS — I491 Atrial premature depolarization: Secondary | ICD-10-CM | POA: Diagnosis not present

## 2024-05-24 ENCOUNTER — Ambulatory Visit

## 2024-05-24 VITALS — Ht 70.0 in | Wt 150.0 lb

## 2024-05-24 DIAGNOSIS — Z0000 Encounter for general adult medical examination without abnormal findings: Secondary | ICD-10-CM

## 2024-05-24 DIAGNOSIS — Z1211 Encounter for screening for malignant neoplasm of colon: Secondary | ICD-10-CM

## 2024-05-24 NOTE — Patient Instructions (Signed)
 Kurt Wagner , Thank you for taking time out of your busy schedule to complete your Annual Wellness Visit with me. I enjoyed our conversation and look forward to speaking with you again next year. I, as well as your care team,  appreciate your ongoing commitment to your health goals. Please review the following plan we discussed and let me know if I can assist you in the future. Your Game plan/ To Do List    Follow up Visits: Next Medicare AWV with our clinical staff: In 1 year    Have you seen your provider in the last 6 months (3 months if uncontrolled diabetes)? Yes Next Office Visit with your provider: To be scheduled   Clinician Recommendations:  Aim for 30 minutes of exercise or brisk walking, 6-8 glasses of water, and 5 servings of fruits and vegetables each day.       This is a list of the screening recommended for you and due dates:  Health Maintenance  Topic Date Due   COVID-19 Vaccine (1) Never done   Pneumococcal Vaccination (1 of 2 - PCV) Never done   Screening for Lung Cancer  Never done   Flu Shot  06/29/2024   Medicare Annual Wellness Visit  05/24/2025   Colon Cancer Screening  04/15/2027   Hepatitis B Vaccine  Aged Out   HPV Vaccine  Aged Out   Meningitis B Vaccine  Aged Out   DTaP/Tdap/Td vaccine  Discontinued   Zoster (Shingles) Vaccine  Discontinued    Advanced directives: (ACP Link)Information on Advanced Care Planning can be found at [place]  Secretary of [facility] Advance Health Care Directives Advance Health Care Directives. [email or web address]   Advance Care Planning is important because it:  [x]  Makes sure you receive the medical care that is consistent with your values, goals, and preferences  [x]  It provides guidance to your family and loved ones and reduces their decisional burden about whether or not they are making the right decisions based on your wishes.  Follow the link provided in your after visit summary or read over the paperwork we have mailed to you  to help you started getting your Advance Directives in place. If you need assistance in completing these, please reach out to us  so that we can help you!  See attachments for Preventive Care and Fall Prevention Tips.

## 2024-05-24 NOTE — Progress Notes (Signed)
 Subjective:   Kurt Wagner is a 63 y.o. who presents for a Medicare Wellness preventive visit.  As a reminder, Annual Wellness Visits don't include a physical exam, and some assessments may be limited, especially if this visit is performed virtually. We may recommend an in-person follow-up visit with your provider if needed.  Visit Complete: Virtual I connected with  Kurt Wagner on 05/24/24 by a audio enabled telemedicine application and verified that I am speaking with the correct person using two identifiers.  Patient Location: Home  Provider Location: Home Office  I discussed the limitations of evaluation and management by telemedicine. The patient expressed understanding and agreed to proceed.  Vital Signs: Because this visit was a virtual/telehealth visit, some criteria may be missing or patient reported. Any vitals not documented were not able to be obtained and vitals that have been documented are patient reported.  VideoDeclined- This patient declined Librarian, academic. Therefore the visit was completed with audio only.  Persons Participating in Visit: Patient.  AWV Questionnaire: Yes: Patient Medicare AWV questionnaire was completed by the patient on 05/17/24; I have confirmed that all information answered by patient is correct and no changes since this date.  Cardiac Risk Factors include: advanced age (>70men, >19 women);hypertension;male gender;smoking/ tobacco exposure     Objective:    Today's Vitals   05/24/24 0835  Weight: 150 lb (68 kg)  Height: 5' 10 (1.778 m)   Body mass index is 21.52 kg/m.     05/24/2024    8:41 AM 08/08/2022    1:11 PM  Advanced Directives  Does Patient Have a Medical Advance Directive? No No  Would patient like information on creating a medical advance directive? Yes (MAU/Ambulatory/Procedural Areas - Information given) No - Patient declined    Current Medications (verified) Outpatient Encounter  Medications as of 05/24/2024  Medication Sig   ASPIRIN 81 PO Take 81 mg by mouth daily.   diltiazem  (CARDIZEM  CD) 240 MG 24 hr capsule Take 1 capsule (240 mg total) by mouth daily.   diphenhydrAMINE  (BENADRYL ) 25 mg capsule Take 25 mg by mouth 2 (two) times daily.   Evolocumab  (REPATHA  SURECLICK) 140 MG/ML SOAJ Inject 140 mg into the skin every 14 (fourteen) days.   ezetimibe  (ZETIA ) 10 MG tablet Take 1 tablet (10 mg total) by mouth daily.   meclizine  (ANTIVERT ) 25 MG tablet Take 1 tablet (25 mg total) by mouth 3 (three) times daily as needed for dizziness.   nitroGLYCERIN  (NITROSTAT ) 0.4 MG SL tablet Place 1 tablet (0.4 mg total) under the tongue every 5 (five) minutes as needed for chest pain.   omeprazole (PRILOSEC) 20 MG capsule Take 20 mg by mouth daily.   topiramate  (TOPAMAX ) 50 MG tablet Take 1 tablet (50 mg total) by mouth 2 (two) times daily.   No facility-administered encounter medications on file as of 05/24/2024.    Allergies (verified) Azithromycin, Fentanyl , Simvastatin, Tape, Varenicline, and Wound dressing adhesive   History: Past Medical History:  Diagnosis Date   Allergy Years ago   Pollen, trees, gras, dust   Arthritis 6 years ago   Hands, neck, back, hips, knees   Ectopic atrial rhythm 12/26/2019   Essential (primary) hypertension    Essential hypertension 12/26/2019   Gastro-esophageal reflux disease with esophagitis    GERD (gastroesophageal reflux disease) Years ago   Intervertebral disc disorders with myelopathy of lumbar region    Lipoma of back 06/24/2016   Mixed hyperlipidemia    Nicotine dependence  unspecified, with withdrawal    Oxygen deficiency 2023   Use prn with cluster headaches   Precordial pain 12/26/2019   Status post coronary artery stent placement 05/12/2022   STEMI involving left anterior descending coronary artery (HCC) 05/12/2022   Formatting of this note might be different from the original.  DES to LAD and POBA to diagonal by Dr.  Toribio.   Tobacco use 12/26/2019   Past Surgical History:  Procedure Laterality Date   APPENDECTOMY     CHEST SURGERY     Tumor inside chest   CORONARY STENT PLACEMENT  05/12/2022   Xience Skypoint 3.0 mm x 33 mmCSN: [id number] UDI-[id number] REF: [id number], LOT [id number]   HERNIA REPAIR     LUMBAR DISC SURGERY     X3   SPINE SURGERY  1990s   Has had 3 laminectomies   TUMOR EXCISION     From back   Family History  Problem Relation Age of Onset   Hypertension Mother    Arthritis Mother    Diabetes Mother    Kidney disease Mother    Atrial fibrillation Father    Heart disease Father    Social History   Socioeconomic History   Marital status: Married    Spouse name: Not on file   Number of children: 2   Years of education: Not on file   Highest education level: GED or equivalent  Occupational History   Not on file  Tobacco Use   Smoking status: Every Day    Current packs/day: 0.50    Average packs/day: 0.7 packs/day for 76.5 years (52.0 ttl pk-yrs)    Types: Cigarettes   Smokeless tobacco: Former   Tobacco comments:    Smoked since age 33  Vaping Use   Vaping status: Former  Substance and Sexual Activity   Alcohol use: Not Currently    Alcohol/week: 12.0 standard drinks of alcohol   Drug use: Not Currently    Types: Marijuana   Sexual activity: Yes    Partners: Female    Birth control/protection: Post-menopausal, None  Other Topics Concern   Not on file  Social History Narrative   Not on file   Social Drivers of Health   Financial Resource Strain: High Risk (05/17/2024)   Overall Financial Resource Strain (CARDIA)    Difficulty of Paying Living Expenses: Hard  Food Insecurity: Food Insecurity Present (05/17/2024)   Hunger Vital Sign    Worried About Running Out of Food in the Last Year: Sometimes true    Ran Out of Food in the Last Year: Never true  Transportation Needs: No Transportation Needs (05/17/2024)   PRAPARE - Doctor, general practice (Medical): No    Lack of Transportation (Non-Medical): No  Physical Activity: Inactive (05/17/2024)   Exercise Vital Sign    Days of Exercise per Week: 0 days    Minutes of Exercise per Session: 0 min  Stress: Stress Concern Present (05/17/2024)   [employer] of Occupational Health - Occupational Stress Questionnaire    Feeling of Stress: Very much  Social Connections: Moderately Integrated (05/17/2024)   Social Connection and Isolation Panel    Frequency of Communication with Friends and Family: More than three times a week    Frequency of Social Gatherings with Friends and Family: Once a week    Attends Religious Services: More than 4 times per year    Active Member of [employer] or Organizations: No    Attends Banker Meetings: Not  on file    Marital Status: Married    Tobacco Counseling Ready to quit: Not Answered Counseling given: Not Answered Tobacco comments: Smoked since age 60    Clinical Intake:  Pre-visit preparation completed: Yes  Pain : No/denies pain     Diabetes: No  Lab Results  Component Value Date   HGBA1C 5.7 (H) 04/30/2024   HGBA1C 5.8 (H) 03/09/2023   HGBA1C 6.0 (H) 08/26/2022     How often do you need to have someone help you when you read instructions, pamphlets, or other written materials from your doctor or pharmacy?: 1 - Never  Interpreter Needed?: No  Information entered by :: Charmaine Bloodgood LPN   Activities of Daily Living     05/17/2024    9:33 AM  In your present state of health, do you have any difficulty performing the following activities:  Hearing? 1  Vision? 0  Difficulty concentrating or making decisions? 0  Walking or climbing stairs? 1  Dressing or bathing? 1  Doing errands, shopping? 0  Preparing Food and eating ? N  Using the Toilet? N  In the past six months, have you accidently leaked urine? N  Do you have problems with loss of bowel control? N  Managing your Medications? N   Managing your Finances? N  Housekeeping or managing your Housekeeping? N    Patient Care Team: Sherre Clapper, MD as PCP - General (Family Medicine) Tobb, Kardie, DO as PCP - Cardiology (Cardiology) Rush Nest, MD (Inactive) (Neurology) Vannie Elsie HERO, OD (Ophthalmology)  I have updated your Care Teams any recent Medical Services you may have received from other providers in the past year.     Assessment:   This is a routine wellness examination for Yosiel.  Hearing/Vision screen Hearing Screening - Comments:: Some hearing loss  Vision Screening - Comments:: up to date with routine eye exams with Dr. Vannie    Goals Addressed             This Visit's Progress    Maintain health and independence         Depression Screen     05/24/2024    8:40 AM 09/06/2023    7:54 AM 05/18/2023    9:04 AM 05/24/2022    2:27 PM 01/07/2021    7:39 AM  PHQ 2/9 Scores  PHQ - 2 Score 0 0 0 0 0  PHQ- 9 Score  6       Fall Risk     05/17/2024    9:33 AM 09/06/2023    7:54 AM 05/18/2023    9:05 AM 05/24/2022    2:26 PM  Fall Risk   Falls in the past year? 0 0 0 1  Number falls in past yr: 0 0 0 0  Injury with Fall? 0 0 0 0  Risk for fall due to : No Fall Risks No Fall Risks No Fall Risks No Fall Risks  Follow up Education provided;Falls prevention discussed;Falls evaluation completed Falls evaluation completed;Falls prevention discussed Falls evaluation completed;Education provided;Falls prevention discussed Falls evaluation completed      Data saved with a previous flowsheet row definition    MEDICARE RISK AT HOME:  Medicare Risk at Home Any stairs in or around the home?: (Patient-Rptd) No If so, are there any without handrails?: (Patient-Rptd) No Home free of loose throw rugs in walkways, pet beds, electrical cords, etc?: (Patient-Rptd) Yes Adequate lighting in your home to reduce risk of falls?: (Patient-Rptd) Yes Life alert?: (Patient-Rptd)  No Use of a cane, walker or  w/c?: (Patient-Rptd) No Grab bars in the bathroom?: (Patient-Rptd) No Shower chair or bench in shower?: (Patient-Rptd) No Elevated toilet seat or a handicapped toilet?: (Patient-Rptd) No  TIMED UP AND GO:  Was the test performed?  No  Cognitive Function: Declined/Normal: No cognitive concerns noted by patient or family. Patient alert, oriented, able to answer questions appropriately and recall recent events. No signs of memory loss or confusion.        Immunizations  There is no immunization history on file for this patient.  Screening Tests Health Maintenance  Topic Date Due   COVID-19 Vaccine (1) Never done   Pneumococcal Vaccine 14-53 Years old (1 of 2 - PCV) Never done   Lung Cancer Screening  Never done   INFLUENZA VACCINE  06/29/2024   Medicare Annual Wellness (AWV)  05/24/2025   Colonoscopy  04/15/2027   Hepatitis B Vaccines  Aged Out   HPV VACCINES  Aged Out   Meningococcal B Vaccine  Aged Out   DTaP/Tdap/Td  Discontinued   Zoster Vaccines- Shingrix  Discontinued    Health Maintenance  Health Maintenance Due  Topic Date Due   COVID-19 Vaccine (1) Never done   Pneumococcal Vaccine 65-61 Years old (1 of 2 - PCV) Never done   Lung Cancer Screening  Never done   Health Maintenance Items Addressed: Referral sent to GI for colonoscopy  Additional Screening:  Vision Screening: Recommended annual ophthalmology exams for early detection of glaucoma and other disorders of the eye. Would you like a referral to an eye doctor? No    Dental Screening: Recommended annual dental exams for proper oral hygiene  Community Resource Referral / Chronic Care Management: CRR required this visit?  No   CCM required this visit?  No   Plan:    I have personally reviewed and noted the following in the patient's chart:   Medical and social history Use of alcohol, tobacco or illicit drugs  Current medications and supplements including opioid prescriptions. Patient is not  currently taking opioid prescriptions. Functional ability and status Nutritional status Physical activity Advanced directives List of other physicians Hospitalizations, surgeries, and ER visits in previous 12 months Vitals Screenings to include cognitive, depression, and falls Referrals and appointments  In addition, I have reviewed and discussed with patient certain preventive protocols, quality metrics, and best practice recommendations. A written personalized care plan for preventive services as well as general preventive health recommendations were provided to patient.   Lavelle Pfeiffer [place], [place]   3/73/7974   After Visit Summary: (MyChart) Due to this being a telephonic visit, the after visit summary with patients personalized plan was offered to patient via MyChart   Notes: Nothing significant to report at this time.

## 2024-05-31 ENCOUNTER — Encounter: Payer: Self-pay | Admitting: Cardiology

## 2024-05-31 NOTE — Telephone Encounter (Signed)
 Called the patient- he reports that this is been happening for a few weeks. It comes and goes with no relation to activity. He has not used any NTG for this, but he has it available if he needs it. He does not have any shortness of breath along with the pains. He does report that he gets winded more easily- he thinks it is due to the heat, though.  Pt reports that he got out early this morning and fed animals before it got hot and he did not get winded like before- due to it not being as hot. He will start doing this from now on.   He last had pain yesterday- and they were just driving- not doing anything strenuous. Has not had any pains today. He denies any symptoms at this time.  Made appt with an APP 06/13/24.   Went over the information below If you experience active Chest Pain, including tightness, pressure, jaw pain, radiating pain to shoulder/upper arm/back, Chest Pain unrelieved by 2-3 doses of Nitroglycerin , Shortness Of Breath, nausea, vomiting, and/or sweating- THEN CALL 911 AND GO TO THE NEAREST EMERGENCY ROOM  Pt verbalized understanding.

## 2024-06-13 ENCOUNTER — Encounter: Payer: Self-pay | Admitting: Cardiology

## 2024-06-13 ENCOUNTER — Ambulatory Visit: Attending: Cardiology | Admitting: Cardiology

## 2024-06-13 ENCOUNTER — Ambulatory Visit: Payer: Self-pay

## 2024-06-13 VITALS — BP 122/74 | HR 88 | Ht 70.5 in | Wt 156.2 lb

## 2024-06-13 DIAGNOSIS — I10 Essential (primary) hypertension: Secondary | ICD-10-CM | POA: Diagnosis not present

## 2024-06-13 DIAGNOSIS — I2510 Atherosclerotic heart disease of native coronary artery without angina pectoris: Secondary | ICD-10-CM | POA: Insufficient documentation

## 2024-06-13 DIAGNOSIS — R079 Chest pain, unspecified: Secondary | ICD-10-CM | POA: Diagnosis not present

## 2024-06-13 DIAGNOSIS — I491 Atrial premature depolarization: Secondary | ICD-10-CM

## 2024-06-13 DIAGNOSIS — E7849 Other hyperlipidemia: Secondary | ICD-10-CM | POA: Insufficient documentation

## 2024-06-13 LAB — BASIC METABOLIC PANEL WITH GFR
BUN/Creatinine Ratio: 11 (ref 10–24)
BUN: 11 mg/dL (ref 8–27)
CO2: 21 mmol/L (ref 20–29)
Calcium: 8.8 mg/dL (ref 8.6–10.2)
Chloride: 106 mmol/L (ref 96–106)
Creatinine, Ser: 0.99 mg/dL (ref 0.76–1.27)
Glucose: 66 mg/dL — ABNORMAL LOW (ref 70–99)
Potassium: 3.8 mmol/L (ref 3.5–5.2)
Sodium: 139 mmol/L (ref 134–144)
eGFR: 87 mL/min/1.73 (ref >59)

## 2024-06-13 LAB — CBC
Hematocrit: 45.9 % (ref 37.5–51.0)
Hemoglobin: 15.3 g/dL (ref 13.0–17.7)
MCH: 31 pg (ref 26.6–33.0)
MCHC: 33.3 g/dL (ref 31.5–35.7)
MCV: 93 fL (ref 79–97)
Platelets: 209 x10E3/uL (ref 150–450)
RBC: 4.93 x10E6/uL (ref 4.14–5.80)
RDW: 13.8 % (ref 11.6–15.4)
WBC: 8 x10E3/uL (ref 3.4–10.8)

## 2024-06-13 LAB — TROPONIN T: Troponin T (Highly Sensitive): <6 ng/L (ref 0–22)

## 2024-06-13 MED ORDER — ISOSORBIDE MONONITRATE ER 30 MG PO TB24: 30.0000 mg | ORAL_TABLET | Freq: Every day | ORAL | 1 refills | Status: DC

## 2024-06-13 NOTE — Progress Notes (Signed)
 Cardiology Office Note:   Date:  06/13/2024  ID:  Kurt Wagner, DOB [birth year], MRN [id number] PCP: Sherre Clapper, MD  [facility] Providers Cardiologist:  Kardie Tobb, DO    History of Present Illness:   Discussed the use of AI scribe software for clinical note transcription with the patient, who gave verbal consent to proceed.  History of Present Illness Kurt Wagner is a 63 year old male with coronary artery disease and ventricular tachycardia who presents with chest pain.  He began experiencing new chest pain symptoms approximately five weeks ago. The pain is sharp, occurs about three times a week, and lasts only a few seconds. It sometimes radiates to his shoulder and is not consistently triggered by any specific activity, occurring even at rest, such as sitting on his porch or walking to get the mail.  On July 4th and the following Sunday, he experienced chest pressure similar to what he felt during his heart attack over a year ago. Nitroglycerin  relieved the pressure and pain on both occasions. He rates the worst pain as a 3 out of 10, using his experience with cluster headaches as a reference for a 10. He last used nitroglycerin  on the Sunday after July 4th and reports that it causes him headaches. Admits he would have taken nitroglycerin  since then but chose not to with headaches.  No consistent shortness of breath, although his wife notes he experiences it occasionally, particularly when the humidity is high or after carrying a bucket of corn to feed deer. He continues to smoke more than a pack a day.  His medication regimen includes daily aspirin, and he is in the process of setting up Repatha .    Studies Reviewed:    EKG:   EKG Interpretation Date/Time:  Wednesday June 13 2024 14:52:40 EDT Ventricular Rate:  89 PR Interval:  128 QRS Duration:  102 QT Interval:  368 QTC Calculation: 447 R Axis:   96  Text Interpretation: Sinus rhythm with Premature atrial  complexes Right atrial enlargement Rightward axis When compared with ECG of 27-Apr-2024 11:07, Premature atrial complexes are now Present Left anterior fascicular block is no longer Present Confirmed by Trudy Birmingham [id number]) on 06/13/2024 3:02:17 PM      Risk Assessment/Calculations:              Physical Exam:   VS:  BP 122/74   Pulse 88   Ht 5' 10.5 (1.791 m)   Wt 156 lb 3.2 oz (70.9 kg)   SpO2 98%   BMI 22.10 kg/m    Wt Readings from Last 3 Encounters:  06/13/24 156 lb 3.2 oz (70.9 kg)  05/24/24 150 lb (68 kg)  04/27/24 150 lb (68 kg)     Physical Exam Vitals reviewed.  Constitutional:      Appearance: Normal appearance.  HENT:     Head: Normocephalic.  Eyes:     Pupils: Pupils are equal, round, and reactive to light.  Cardiovascular:     Rate and Rhythm: Normal rate and regular rhythm.     Pulses: Normal pulses.     Heart sounds: Normal heart sounds.  Pulmonary:     Effort: Pulmonary effort is normal.     Breath sounds: Normal breath sounds.  Abdominal:     General: Abdomen is flat.     Palpations: Abdomen is soft.  Musculoskeletal:     Right lower leg: No edema.     Left lower leg: No edema.  Skin:  General: Skin is warm and dry.     Capillary Refill: Capillary refill takes less than 2 seconds.  Neurological:     General: No focal deficit present.     Mental Status: He is alert and oriented to person, place, and time.  Psychiatric:        Mood and Affect: Mood normal.        Behavior: Behavior normal.        Thought Content: Thought content normal.        Judgment: Judgment normal.      ASSESSMENT AND PLAN:    Assessment & Plan Coronary artery disease with angina Patient with prior anterior STEMI (treated at [facility]). Previous stent placement in LAD and balloon angioplasty in diagonal artery. Intermittent chest pain and pressure, occurring approximately three times a week, with episodes of sharp discomfort and pressure similar to  previous myocardial infarction symptoms. Symptoms relieved by nitroglycerin . Concerns of recurrent obstruction due to previous angioplasty without stent placement. No high risk changes on EKG. Concern for coronary vessel obstruction. Heart catheterization discussed, including risks of stroke, bleeding, arrhythmia, and kidney dysfunction. - Order heart catheterization. By current lab schedule, first outpatient availability is next week (patient cannot go next Tuesday). Discussed timing with Dr. Court (DOD) who agreed with plan. - Check Troponin T. If elevated, would send patient to the ED for expedited ischemic evaluation. - Prescribe Imdur  30mg  - Instruct to go to the emergency department if significant chest tightness or sharp discomfort occurs before LHC - Check baseline kidney function labs prior to catheterization. - Continue aspirin therapy.  Hyperlipidemia with statin intolerance Patient with LDL 76, above goal 55mg /dL. Previously had joint pain on Atorvastatin. Is pending initiation of PCSK9.       Informed Consent   Shared Decision Making/Informed Consent The risks [stroke (1 in 1000), death (1 in 1000), kidney failure [usually temporary] (1 in 500), bleeding (1 in 200), allergic reaction [possibly serious] (1 in 200)], benefits (diagnostic support and management of coronary artery disease) and alternatives of a cardiac catheterization were discussed in detail with Mr. Biegel and he is willing to proceed.      Signed, Artist Pouch, PA-C

## 2024-06-13 NOTE — H&P (View-Only) (Signed)
 Cardiology Office Note:   Date:  06/13/2024  ID:  JEANCARLO LEFFLER, DOB [birth year], MRN [id number] PCP: Sherre Clapper, MD  [facility] Providers Cardiologist:  Kardie Tobb, DO    History of Present Illness:   Discussed the use of AI scribe software for clinical note transcription with the patient, who gave verbal consent to proceed.  History of Present Illness Kurt Wagner is a 63 year old male with coronary artery disease and ventricular tachycardia who presents with chest pain.  He began experiencing new chest pain symptoms approximately five weeks ago. The pain is sharp, occurs about three times a week, and lasts only a few seconds. It sometimes radiates to his shoulder and is not consistently triggered by any specific activity, occurring even at rest, such as sitting on his porch or walking to get the mail.  On July 4th and the following Sunday, he experienced chest pressure similar to what he felt during his heart attack over a year ago. Nitroglycerin  relieved the pressure and pain on both occasions. He rates the worst pain as a 3 out of 10, using his experience with cluster headaches as a reference for a 10. He last used nitroglycerin  on the Sunday after July 4th and reports that it causes him headaches. Admits he would have taken nitroglycerin  since then but chose not to with headaches.  No consistent shortness of breath, although his wife notes he experiences it occasionally, particularly when the humidity is high or after carrying a bucket of corn to feed deer. He continues to smoke more than a pack a day.  His medication regimen includes daily aspirin, and he is in the process of setting up Repatha .    Studies Reviewed:    EKG:   EKG Interpretation Date/Time:  Wednesday June 13 2024 14:52:40 EDT Ventricular Rate:  89 PR Interval:  128 QRS Duration:  102 QT Interval:  368 QTC Calculation: 447 R Axis:   96  Text Interpretation: Sinus rhythm with Premature atrial  complexes Right atrial enlargement Rightward axis When compared with ECG of 27-Apr-2024 11:07, Premature atrial complexes are now Present Left anterior fascicular block is no longer Present Confirmed by Trudy Birmingham [id number]) on 06/13/2024 3:02:17 PM      Risk Assessment/Calculations:              Physical Exam:   VS:  BP 122/74   Pulse 88   Ht 5' 10.5 (1.791 m)   Wt 156 lb 3.2 oz (70.9 kg)   SpO2 98%   BMI 22.10 kg/m    Wt Readings from Last 3 Encounters:  06/13/24 156 lb 3.2 oz (70.9 kg)  05/24/24 150 lb (68 kg)  04/27/24 150 lb (68 kg)     Physical Exam Vitals reviewed.  Constitutional:      Appearance: Normal appearance.  HENT:     Head: Normocephalic.  Eyes:     Pupils: Pupils are equal, round, and reactive to light.  Cardiovascular:     Rate and Rhythm: Normal rate and regular rhythm.     Pulses: Normal pulses.     Heart sounds: Normal heart sounds.  Pulmonary:     Effort: Pulmonary effort is normal.     Breath sounds: Normal breath sounds.  Abdominal:     General: Abdomen is flat.     Palpations: Abdomen is soft.  Musculoskeletal:     Right lower leg: No edema.     Left lower leg: No edema.  Skin:  General: Skin is warm and dry.     Capillary Refill: Capillary refill takes less than 2 seconds.  Neurological:     General: No focal deficit present.     Mental Status: He is alert and oriented to person, place, and time.  Psychiatric:        Mood and Affect: Mood normal.        Behavior: Behavior normal.        Thought Content: Thought content normal.        Judgment: Judgment normal.      ASSESSMENT AND PLAN:    Assessment & Plan Coronary artery disease with angina Patient with prior anterior STEMI (treated at [facility]). Previous stent placement in LAD and balloon angioplasty in diagonal artery. Intermittent chest pain and pressure, occurring approximately three times a week, with episodes of sharp discomfort and pressure similar to  previous myocardial infarction symptoms. Symptoms relieved by nitroglycerin . Concerns of recurrent obstruction due to previous angioplasty without stent placement. No high risk changes on EKG. Concern for coronary vessel obstruction. Heart catheterization discussed, including risks of stroke, bleeding, arrhythmia, and kidney dysfunction. - Order heart catheterization. By current lab schedule, first outpatient availability is next week (patient cannot go next Tuesday). Discussed timing with Dr. Court (DOD) who agreed with plan. - Check Troponin T. If elevated, would send patient to the ED for expedited ischemic evaluation. - Prescribe Imdur  30mg  - Instruct to go to the emergency department if significant chest tightness or sharp discomfort occurs before LHC - Check baseline kidney function labs prior to catheterization. - Continue aspirin therapy.  Hyperlipidemia with statin intolerance Patient with LDL 76, above goal 55mg /dL. Previously had joint pain on Atorvastatin. Is pending initiation of PCSK9.       Informed Consent   Shared Decision Making/Informed Consent The risks [stroke (1 in 1000), death (1 in 1000), kidney failure [usually temporary] (1 in 500), bleeding (1 in 200), allergic reaction [possibly serious] (1 in 200)], benefits (diagnostic support and management of coronary artery disease) and alternatives of a cardiac catheterization were discussed in detail with Mr. Biegel and he is willing to proceed.      Signed, Artist Pouch, PA-C

## 2024-06-13 NOTE — Patient Instructions (Addendum)
 Medication Instructions:  Your physician has recommended you make the following change in your medication:   START Imdur  (Isosorbide ) 30 mg taking 1 daily  *If you need a refill on your cardiac medications before your next appointment, please call your pharmacy*  Lab Work: TODAY:  STAT TROPONIN, BMET & CBC  If you have labs (blood work) drawn today and your tests are completely normal, you will receive your results only by: MyChart Message (if you have MyChart) OR A paper copy in the mail If you have any lab test that is abnormal or we need to change your treatment, we will call you to review the results.  Testing/Procedures: Your physician has requested that you have a cardiac catheterization. Cardiac catheterization is used to diagnose and/or treat various heart conditions. Doctors may recommend this procedure for a number of different reasons. The most common reason is to evaluate chest pain. Chest pain can be a symptom of coronary artery disease (CAD), and cardiac catheterization can show whether plaque is narrowing or blocking your heart's arteries. This procedure is also used to evaluate the valves, as well as measure the blood flow and oxygen levels in different parts of your heart. For further information please visit [email or web address] Please follow instruction sheet, BELOW:        Cardiac/Peripheral Catheterization   You are scheduled for a Cardiac Catheterization on Tuesday, July 23 with Dr. Alm Clay.  1. Please arrive at the [facility] (Main Entrance A) at [facility]: [address] [place], [place] [address] at 8:00 A.M. (This time is 2 hour(s) before your procedure to ensure your preparation).   Free valet parking service is available. You will check in at ADMITTING. The support person will be asked to wait in the waiting room.  It is OK to have someone drop you off and come back when you are ready to be discharged.        Special note: Every effort is  made to have your procedure done on time. Please understand that emergencies sometimes delay scheduled procedures.  2. Diet: Do not eat solid foods after midnight.  You may have clear liquids until 5 AM the day of the procedure.  3. Labs: You will need to have blood drawn on TODAY  4. Medication instructions in preparation for your procedure:   Contrast Allergy: No   On the morning of your procedure, take Aspirin 81 mg and any morning medicines NOT listed above.  You may use sips of water.  5. Plan to go home the same day, you will only stay overnight if medically necessary. 6. You MUST have a responsible adult to drive you home. 7. An adult MUST be with you the first 24 hours after you arrive home. 8. Bring a current list of your medications, and the last time and date medication taken. 9. Bring ID and current insurance cards. 10.Please wear clothes that are easy to get on and off and wear slip-on shoes.  Thank you for allowing us  to care for you!   -- [facility]   Follow-Up: At [facility], you and your health needs are our priority.  As part of our continuing mission to provide you with exceptional heart care, our providers are all part of one team.  This team includes your primary Cardiologist (physician) and Advanced Practice Providers or APPs (Physician Assistants and Nurse Practitioners) who all work together to provide you with the care you need, when you need  it.  Your next appointment:   2 week(s)  Provider:   Glendia Ferrier, PA-C     We recommend signing up for the patient portal called MyChart.  Sign up information is provided on this After Visit Summary.  MyChart is used to connect with patients for Virtual Visits (Telemedicine).  Patients are able to view lab/test results, encounter notes, upcoming appointments, etc.  Non-urgent messages can be sent to your provider as well.   To learn more about what you can do with MyChart,  go to [email or web address].   Other Instructions

## 2024-06-14 ENCOUNTER — Other Ambulatory Visit (HOSPITAL_COMMUNITY): Payer: Self-pay

## 2024-06-14 ENCOUNTER — Ambulatory Visit (INDEPENDENT_AMBULATORY_CARE_PROVIDER_SITE_OTHER): Admitting: Pharmacist

## 2024-06-14 ENCOUNTER — Encounter: Payer: Self-pay | Admitting: Pharmacist

## 2024-06-14 DIAGNOSIS — I2510 Atherosclerotic heart disease of native coronary artery without angina pectoris: Secondary | ICD-10-CM | POA: Diagnosis not present

## 2024-06-14 DIAGNOSIS — R079 Chest pain, unspecified: Secondary | ICD-10-CM | POA: Diagnosis not present

## 2024-06-14 DIAGNOSIS — I10 Essential (primary) hypertension: Secondary | ICD-10-CM | POA: Diagnosis not present

## 2024-06-14 DIAGNOSIS — E7849 Other hyperlipidemia: Secondary | ICD-10-CM | POA: Diagnosis not present

## 2024-06-14 MED ORDER — ROSUVASTATIN CALCIUM 5 MG PO TABS
5.0000 mg | ORAL_TABLET | Freq: Every day | ORAL | 3 refills | Status: DC
  Filled 2024-06-14: qty 90, 90d supply, fill #0

## 2024-06-14 NOTE — Assessment & Plan Note (Signed)
 Assessment:  LDL goal: < 55 mg/dl last LDLc 60 mg/dl (96/7974)typoz on Atorvastatin 80 gm daily  Current therapy Zetia  10 mg daily; takes and tolerate that well  Intolerance to high and moderate intensity statins simvastatin and atorvastatin 80 gm daily   Discussed next potential options (PCSK-9 inhibitors, bempedoic acid  and inclisiran); cost, dosing efficacy, side effects  Patient does not have part D insurance and their income is higher for last year for Repatha  PAP application; however their income for this year is low which might meet criteria next year for PAP  Plan: Continue taking current medications (Zetia  10 mg daily)  Start taking Crestor  5 mg daily if tolerated well up the dose to 10 mg daily and patient to report max tolerated dose via phone to me in 1 month  Will see if income criteria fits PAP requirement for Repatha   Lipid lab due in 2-3 months of therapy modification

## 2024-06-14 NOTE — Patient Instructions (Signed)
 Your Results:             Your most recent labs Goal  Total Cholesterol 113 < 200  Triglycerides 88 < 150  HDL (happy/good cholesterol) 36 > 40  LDL (lousy/bad cholesterol 60 < 55   Medication changes: start taking Crestor  5 mg daily if tolerated well increase dose as directed. Continue taking ezetimibe  10 mg daily.We will start the process to get Repatha   covered by patient assistance program.     Repatha  is a cholesterol medication that improved your body's ability to get rid of bad cholesterol known as LDL. It can lower your LDL up to 60%! It is an injection that is given under the skin every 2 weeks. The medication often requires a prior authorization from your insurance company. We will take care of submitting all the necessary information to your insurance company to get it approved. The most common side effects of Repatha  include runny nose, symptoms of the common cold, rarely flu or flu-like symptoms, back/muscle pain in about 3-4% of the patients, and redness, pain, or bruising at the injection site.   Lab orders: We want to repeat labs after 2-3 months.  We will send you a lab order to remind you once we get closer to that time.

## 2024-06-14 NOTE — Progress Notes (Signed)
 Patient ID: Kurt Wagner                 DOB: 10/13/1961                    MRN: [id number]      HPI: Kurt Wagner is a 63 y.o. male patient referred to lipid clinic by Dr.Tobb. PMH is significant for hx of STEMI, DES, coronary artery disease, hyperlipidemia, hypertension,ventricular tachycardia.   Atorvastatin led to joint pain so it was discontinued. LDL was 76 while on high intensity statins was still above the goal <55.He was previously on simvastatin before switching to Lipitor.  Patient presented to the Lipid Clinic accompanied by his wife. He reports not having medication insurance and currently holds only Medicare Part A and B. Although their income last year exceeded the eligibility threshold for the [employer] Patient Assistance Program (PAP), their income this year is significantly lower. If current income is considered, we will proceed with an application for PAP to access Repatha , given the patient's documented statin intolerance.  In the interim, the patient is willing to trial Crestor  (rosuvastatin ) 5 mg daily. He has been educated on dose titration to reach the maximum tolerated dose, and instructed to follow up via phone in one month to report tolerance and response.  He tolerates Zetia  (ezetimibe ) well, which will be continued.  We also engaged in a detailed discussion about smoking cessation. Patient has been provided with strategies to initially reduce cigarette consumption with the long-term goal of complete cessation. Both patient and his wife are in agreement and motivated to work towards reduction in cigarette use.  Current Medications: Zetia  10 mg daily  Intolerances: Simvastatin and Atorvastatin  Risk Factors: hx of STEMI, DES, coronary artery disease, hyperlipidemia, hypertension,prediabetes  LDL goal: <55 mg/dl  Last lipid lab 96/7974 LDL 60, TG 88, TC 113, HDL 36  Diet: eats only home cooked meals and it is usually low salt and low fat. Does not fry food and does  not eat out   Exercise: none due to chest pain - however stay active around the house and go for short walks multiple times per day   Family History:  Relation Problem Comments  Mother - Heron Arthritis   Diabetes   Hypertension   Kidney disease     Father - Kurt Wagner Atrial fibrillation   Heart disease      Social History:  Smoking: 1 pack a day  Labs:  Lipid Panel     Component Value Date/Time   CHOL 113 02/22/2024 1149   TRIG 88 02/22/2024 1149   HDL 36 (L) 02/22/2024 1149   CHOLHDL 3.1 02/22/2024 1149   LDLCALC 60 02/22/2024 1149   LABVLDL 17 02/22/2024 1149    Past Medical History:  Diagnosis Date   Allergy Years ago   Pollen, trees, gras, dust   Arthritis 6 years ago   Hands, neck, back, hips, knees   Ectopic atrial rhythm 12/26/2019   Essential (primary) hypertension    Essential hypertension 12/26/2019   Gastro-esophageal reflux disease with esophagitis    GERD (gastroesophageal reflux disease) Years ago   Intervertebral disc disorders with myelopathy of lumbar region    Lipoma of back 06/24/2016   Mixed hyperlipidemia    Nicotine dependence unspecified, with withdrawal    Oxygen deficiency 2023   Use prn with cluster headaches   Precordial pain 12/26/2019   Status post coronary artery stent placement 05/12/2022   STEMI involving left anterior  descending coronary artery (HCC) 05/12/2022   Formatting of this note might be different from the original.  DES to LAD and POBA to diagonal by Dr. Toribio.   Tobacco use 12/26/2019    Current Outpatient Medications on File Prior to Visit  Medication Sig Dispense Refill   ASPIRIN 81 PO Take 81 mg by mouth daily.     diltiazem  (CARDIZEM  CD) 240 MG 24 hr capsule Take 1 capsule (240 mg total) by mouth daily. 90 capsule 3   diphenhydrAMINE  (BENADRYL ) 25 mg capsule Take 25 mg by mouth 2 (two) times daily.     Evolocumab  (REPATHA  SURECLICK) 140 MG/ML SOAJ Inject 140 mg into the skin every 14 (fourteen) days. 2 mL 2    ezetimibe  (ZETIA ) 10 MG tablet Take 1 tablet (10 mg total) by mouth daily. 90 tablet 3   isosorbide  mononitrate (IMDUR ) 30 MG 24 hr tablet Take 1 tablet (30 mg total) by mouth daily. 30 tablet 1   meclizine  (ANTIVERT ) 25 MG tablet Take 1 tablet (25 mg total) by mouth 3 (three) times daily as needed for dizziness. 30 tablet 0   nitroGLYCERIN  (NITROSTAT ) 0.4 MG SL tablet Place 1 tablet (0.4 mg total) under the tongue every 5 (five) minutes as needed for chest pain. 25 tablet 6   omeprazole (PRILOSEC) 20 MG capsule Take 20 mg by mouth daily.     topiramate  (TOPAMAX ) 50 MG tablet Take 1 tablet (50 mg total) by mouth 2 (two) times daily. 180 tablet 4   No current facility-administered medications on file prior to visit.    Allergies  Allergen Reactions   Azithromycin Other (See Comments)    unknown   Fentanyl  Other (See Comments)    Headache   Simvastatin Other (See Comments)    Joint pain   Tape Other (See Comments)    Blisters   Varenicline Other (See Comments)    unknown   Wound Dressing Adhesive Other (See Comments)    Blisters    Assessment/Plan:  1. Hyperlipidemia -  Problem  Hld (Hyperlipidemia)   Current Medications: Zetia  10 mg daily  Intolerances: Simvastatin and Atorvastatin  Risk Factors: hx of STEMI, DES, coronary artery disease, hyperlipidemia, hypertension, prediabetes  LDL goal: <55 mg/dl     HLD (hyperlipidemia) Assessment:  LDL goal: < 55 mg/dl last LDLc 60 mg/dl (96/7974)typoz on Atorvastatin 80 gm daily  Current therapy Zetia  10 mg daily; takes and tolerate that well  Intolerance to high and moderate intensity statins simvastatin and atorvastatin 80 gm daily   Discussed next potential options (PCSK-9 inhibitors, bempedoic acid  and inclisiran); cost, dosing efficacy, side effects  Patient does not have part D insurance and their income is higher for last year for Repatha  PAP application; however their income for this year is low which might meet criteria next  year for PAP  Plan: Continue taking current medications (Zetia  10 mg daily)  Start taking Crestor  5 mg daily if tolerated well up the dose to 10 mg daily and patient to report max tolerated dose via phone to me in 1 month  Will see if income criteria fits PAP requirement for Repatha   Lipid lab due in 2-3 months of therapy modification     Thank you,  Robbi Blanch, Pharm.D [place] [facility]. [facility] [address], [place], [place] [address] Phone: [phone]; Fax: [phone]

## 2024-06-15 ENCOUNTER — Telehealth: Payer: Self-pay | Admitting: Pharmacy Technician

## 2024-06-15 ENCOUNTER — Other Ambulatory Visit (HOSPITAL_COMMUNITY): Payer: Self-pay

## 2024-06-15 NOTE — Telephone Encounter (Signed)
 The patient called back and said they made 115,000 household income. Per [employer] that is over the income. They said they will call back if that changes. Closing for now

## 2024-06-15 NOTE — Telephone Encounter (Addendum)
 Patient application scanned in media. I put in the provider box for provider to sign. Still missing income amount. Lmom for patient to ask income

## 2024-06-17 ENCOUNTER — Encounter (HOSPITAL_COMMUNITY): Payer: Self-pay | Admitting: Cardiology

## 2024-06-18 ENCOUNTER — Telehealth: Payer: Self-pay | Admitting: *Deleted

## 2024-06-18 NOTE — Telephone Encounter (Signed)
 Cardiac Catheterization scheduled at [facility] for: Wednesday June 20, 2024 10 AM Arrival time [facility] Main Entrance A at: 8 AM  Nothing to eat after midnight prior to procedure, clear liquids until 5 AM day of procedure.  Medication instructions: -Usual morning medications can be taken with sips of water including aspirin  81 mg.  Plan to go home the same day, you will only stay overnight if medically necessary.  You must have responsible adult to drive you home.  Someone must be with you the first 24 hours after you arrive home.  Reviewed procedure instructions with patient.

## 2024-06-20 ENCOUNTER — Encounter (HOSPITAL_COMMUNITY)
Admission: RE | Disposition: A | Discharge status: Home / Self Care | Payer: Self-pay | Source: Home / Self Care | Attending: Cardiology

## 2024-06-20 ENCOUNTER — Encounter (HOSPITAL_COMMUNITY): Payer: Self-pay | Admitting: Cardiology

## 2024-06-20 ENCOUNTER — Other Ambulatory Visit: Payer: Self-pay

## 2024-06-20 ENCOUNTER — Ambulatory Visit (HOSPITAL_COMMUNITY)
Admission: RE | Admit: 2024-06-20 | Discharge: 2024-06-20 | Disposition: A | Discharge status: Home / Self Care | Attending: Cardiology | Admitting: Cardiology

## 2024-06-20 DIAGNOSIS — I252 Old myocardial infarction: Secondary | ICD-10-CM | POA: Diagnosis not present

## 2024-06-20 DIAGNOSIS — Z7982 Long term (current) use of aspirin: Secondary | ICD-10-CM | POA: Diagnosis not present

## 2024-06-20 DIAGNOSIS — Z79899 Other long term (current) drug therapy: Secondary | ICD-10-CM | POA: Diagnosis not present

## 2024-06-20 DIAGNOSIS — E785 Hyperlipidemia, unspecified: Secondary | ICD-10-CM | POA: Insufficient documentation

## 2024-06-20 DIAGNOSIS — I2510 Atherosclerotic heart disease of native coronary artery without angina pectoris: Secondary | ICD-10-CM | POA: Diagnosis not present

## 2024-06-20 DIAGNOSIS — I25119 Atherosclerotic heart disease of native coronary artery with unspecified angina pectoris: Secondary | ICD-10-CM | POA: Diagnosis not present

## 2024-06-20 DIAGNOSIS — I472 Ventricular tachycardia, unspecified: Secondary | ICD-10-CM | POA: Diagnosis not present

## 2024-06-20 DIAGNOSIS — Z955 Presence of coronary angioplasty implant and graft: Secondary | ICD-10-CM | POA: Insufficient documentation

## 2024-06-20 DIAGNOSIS — F17210 Nicotine dependence, cigarettes, uncomplicated: Secondary | ICD-10-CM | POA: Diagnosis not present

## 2024-06-20 HISTORY — PX: LEFT HEART CATH AND CORONARY ANGIOGRAPHY: CATH118249

## 2024-06-20 SURGERY — LEFT HEART CATH AND CORONARY ANGIOGRAPHY
Anesthesia: LOCAL

## 2024-06-20 MED ORDER — LIDOCAINE HCL (PF) 1 % IJ SOLN
INTRAMUSCULAR | Status: AC
  Filled 2024-06-20: qty 30

## 2024-06-20 MED ORDER — SODIUM CHLORIDE 0.9 % WEIGHT BASED INFUSION: 3.0000 mL/kg/h | INTRAVENOUS | Status: AC

## 2024-06-20 MED ORDER — HEPARIN (PORCINE) IN NACL 2-0.9 UNITS/ML
INTRAMUSCULAR | Status: DC | PRN
  Administered 2024-06-20: 10 mL via INTRA_ARTERIAL

## 2024-06-20 MED ORDER — SODIUM CHLORIDE 0.9 % WEIGHT BASED INFUSION: 1.0000 mL/kg/h | INTRAVENOUS | Status: DC

## 2024-06-20 MED ORDER — ASPIRIN 81 MG PO CHEW: 81.0000 mg | CHEWABLE_TABLET | ORAL | Status: DC

## 2024-06-20 MED ORDER — VERAPAMIL HCL 2.5 MG/ML IV SOLN
INTRAVENOUS | Status: AC
  Filled 2024-06-20: qty 2

## 2024-06-20 MED ORDER — HEPARIN SODIUM (PORCINE) 1000 UNIT/ML IJ SOLN
INTRAMUSCULAR | Status: AC
  Filled 2024-06-20: qty 10

## 2024-06-20 MED ORDER — HEPARIN (PORCINE) IN NACL 1000-0.9 UT/500ML-% IV SOLN
INTRAVENOUS | Status: DC | PRN
  Administered 2024-06-20 (×2): 500 mL

## 2024-06-20 MED ORDER — MIDAZOLAM HCL 2 MG/2ML IJ SOLN
INTRAMUSCULAR | Status: AC
  Filled 2024-06-20: qty 2

## 2024-06-20 MED ORDER — LIDOCAINE HCL (PF) 1 % IJ SOLN
INTRAMUSCULAR | Status: DC | PRN
Start: 2024-06-20 — End: 2024-06-20
  Administered 2024-06-20: 2 mL

## 2024-06-20 MED ORDER — HEPARIN SODIUM (PORCINE) 1000 UNIT/ML IJ SOLN
INTRAMUSCULAR | Status: DC | PRN
  Administered 2024-06-20: 3500 [IU] via INTRAVENOUS

## 2024-06-20 MED ORDER — MIDAZOLAM HCL 2 MG/2ML IJ SOLN
INTRAMUSCULAR | Status: DC | PRN
  Administered 2024-06-20: 1 mg via INTRAVENOUS

## 2024-06-20 MED ORDER — IOHEXOL 350 MG/ML SOLN
INTRAVENOUS | Status: DC | PRN
Start: 2024-06-20 — End: 2024-06-20
  Administered 2024-06-20: 55 mL

## 2024-06-20 SURGICAL SUPPLY — 8 items
CATH INFINITI 5FR ANG PIGTAIL (CATHETERS) IMPLANT
CATH INFINITI AMBI 5FR TG (CATHETERS) IMPLANT
DEVICE RAD COMP TR BAND LRG (VASCULAR PRODUCTS) IMPLANT
GLIDESHEATH SLEND SS 6F .021 (SHEATH) IMPLANT
GUIDEWIRE INQWIRE 1.5J.035X260 (WIRE) IMPLANT
PACK CARDIAC CATHETERIZATION (CUSTOM PROCEDURE TRAY) ×1 IMPLANT
SET ATX-X65L (MISCELLANEOUS) IMPLANT
SHEATH PROBE COVER 6X72 (BAG) IMPLANT

## 2024-06-20 NOTE — Progress Notes (Signed)
 TR BAND REMOVAL  LOCATION:    right radial  DEFLATED PER PROTOCOL:    Yes.    TIME BAND OFF / DRESSING APPLIED: 06/20/24 at 1250   SITE UPON ARRIVAL:    Level 0  SITE AFTER BAND REMOVAL:    Level 0  CIRCULATION SENSATION AND MOVEMENT:    Within Normal Limits   Yes.    COMMENTS:

## 2024-06-20 NOTE — Interval H&P Note (Signed)
 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  The various methods of treatment have been discussed with the patient and family. After consideration of risks, benefits and other options for treatment, the patient has consented to  Procedure(s): LEFT HEART CATH AND CORONARY ANGIOGRAPHY (N/A)  PERCUTANEOUS CORONARY INTERVENTION   as a surgical intervention.  The patient's history has been reviewed, patient examined, no change in status, stable for surgery.  I have reviewed the patient's chart and labs.  Questions were answered to the patient's satisfaction.    Cath Lab Visit (complete for each Cath Lab visit)  Clinical Evaluation Leading to the Procedure:   ACS: No.  Non-ACS:    Anginal Classification: CCS III  Anti-ischemic medical therapy: Minimal Therapy (1 class of medications)  Non-Invasive Test Results: No non-invasive testing performed  Prior CABG: No previous CABG    Alm Clay

## 2024-06-26 ENCOUNTER — Encounter: Payer: Self-pay | Admitting: Cardiology

## 2024-06-28 ENCOUNTER — Ambulatory Visit: Payer: Medicare Other | Admitting: Adult Health

## 2024-06-28 ENCOUNTER — Ambulatory Visit: Payer: Medicare Other | Admitting: Neurology

## 2024-06-28 ENCOUNTER — Encounter: Payer: Self-pay | Admitting: Adult Health

## 2024-06-28 VITALS — BP 126/87 | HR 82 | Ht 70.0 in | Wt 157.0 lb

## 2024-06-28 DIAGNOSIS — G44019 Episodic cluster headache, not intractable: Secondary | ICD-10-CM

## 2024-06-28 DIAGNOSIS — M5416 Radiculopathy, lumbar region: Secondary | ICD-10-CM | POA: Diagnosis not present

## 2024-06-28 NOTE — Progress Notes (Addendum)
 PATIENT: Kurt Wagner DOB: December 25, 1960  REASON FOR VISIT: follow up HISTORY FROM: patient PRIMARY NEUROLOGIST: Dr. Ines   Chief Complaint  Patient presents with   Follow-up    Pt in room 4 with wife Lumbar radiculopathy angie f/u  Pt states headaches better Pt states no more symptoms of Lumbar radiculopathy so declined surgery Pt is asking for an order for portable oxygen and renewal home oxygen      HISTORY OF PRESENT ILLNESS: Today 06/28/24   Kurt Wagner is a 63 y.o. male who has been followed in this office for cluster headaches and low back pain with peroneal neuropathy. Returns today for follow-up.  Patient to follow-up with neurosurgery and was advised that surgery would not be beneficial for his numbness.  For now he was monitoring symptoms.  He states overall he has remained relatively stable.  No significant discomfort.  He regards to his cluster headaches he has not had any issues this year.  He states that he did take try the Emgality  sample that Dr. Ines gave him and he did find that beneficial.  Unfortunately he will need samples due to insurance not covering the medication for them.  He does state that strobe lights is a trigger for his headaches.  Has O2 at home that he finds beneficial but will be needing a refill. he returns today for an evaluation.   HISTORY 12/12/2023: Today patient is here for follow-up of cluster headaches, this is a patient that was seeing Dr. Rush in our practice and is transitioning to my care.  He was last seen in August 2024 and I reviewed the notes, he was having right-sided headaches, associated with nasal congestion and right ptosis, occurring at the same time every day and lasting 2 to 3 hours at a time.  Diagnosed with cluster headaches.  It appears he was started on 300 mg of Emgality  a month, given oxygen and topiramate .  He has tried a plethora of medications per Dr. Hartwell notes (see below).  When she saw him last he was  in a cluster cycle.  He previously tried prednisone  with Magick the headaches worse.  He could not take verapamil  as he was already taking calcium  channel blockers for heart condition.  Cannot take triptans due to coronary artery disease. Per review of records, he saw his pcp 09/06/2023 for headaches and dizziness, he was wearing  aheart monitor, started with cluster headaches in the 1990s and O2 appeared to be helping. He was also seen for his snoring and possible sleep evaluation which is excellent.he saw ENT Dr. Karis for dizziness who stated Recurrent dizziness, likely secondary to systemic and multifactorial causes. His history is suggestive of cardiovascular insufficiency.  And Bilateral symmetric high-frequency sensorineural hearing loss, likely secondary to routine presbycusis.  And he was sent to audiology. He is still smoking? Per Darin Graven: coronary artery disease, STEMI involving left anterior descending coronary artery, paroxysmal supraventricular tachycardia, hypertension, hyperlipidemia, bradycardia, fatigue, cervical spine stenosis with radiculopathy at C6 and snoring. He has medication allergy to azithromycin, fentanyl , simvastatin and Varenicline. He is a pack-a-day smoker who started smoking around age 31 (33)  but sleep eval results showed [facility] Home sleep study 11/29/2023 has an AHI of 0.4 which is not diagnostic of obstructive sleep apnea at this time .    He could not afford the emgality . But the topiramate  combined with the oxygen was amazingly effective. His cycle is over and he still has oxygen left when  the next one happens, they usually start around August and last for a few months until October. Continue the topiramate  to once a day and at the start of the cycle go back to twice a day. He also has numbness of the left top lateral of the left foot. He is still smoking, discussed risks of smoking. He has back pain, new numbness in the left foot, weakness in leg in a pattern  of L5/S1.    Last visit: 11/02/22   Brief HPI: 63 year old male with a history of CAD, STEMI s/p LAD stent, HTN, smoking, cervical radiculopathy who follows in clinic for cluster headaches.   Interval History: He has started to have right-sided headaches in the past 4 days. They are associated with nasal congestion and right ptosis. They have been occurring between 4-5 pm every day. They last ~2-3 hours at a time. He is taking Advil migraine which has only been somewhat effective.      Headache days per month: 4 Headache free days per month: 26   Current Headache Regimen: Preventative: none Abortive: Advil     Prior Therapies                                  Prevention: Metoprolol 25 mg BID Carvedilol  6.25 mg BID Amlodipine 2.5 mg  Diltiazem  120 mg daily Lisinopril  20 mg daily Cymbalta 60 mg daily Nortriptyline 150 mg QHS Topamax  25 mg BID Prednisone  - worsened headache   Rescue: ibuprofen Meloxicam Maxalt  10 mg PRN Cannot take triptans due to CAD    REVIEW OF SYSTEMS: Out of a complete 14 system review of symptoms, the patient complains only of the following symptoms, and all other reviewed systems are negative.   Listed in HPI  ALLERGIES: Allergies  Allergen Reactions   Azithromycin Other (See Comments)    unknown   Fentanyl  Other (See Comments)    Headache   Simvastatin Other (See Comments)    Joint pain   Tape Other (See Comments)    Blisters   Varenicline Other (See Comments)    unknown   Wound Dressing Adhesive Other (See Comments)    Blisters    HOME MEDICATIONS: Outpatient Medications Prior to Visit  Medication Sig Dispense Refill   ASPIRIN  81 PO Take 81 mg by mouth daily.     diltiazem  (CARDIZEM  CD) 240 MG 24 hr capsule Take 1 capsule (240 mg total) by mouth daily. 90 capsule 3   diphenhydrAMINE  (BENADRYL ) 25 mg capsule Take 25 mg by mouth 2 (two) times daily as needed for allergies.     ezetimibe  (ZETIA ) 10 MG tablet Take 1 tablet (10 mg  total) by mouth daily. 90 tablet 3   meclizine  (ANTIVERT ) 25 MG tablet Take 1 tablet (25 mg total) by mouth 3 (three) times daily as needed for dizziness. 30 tablet 0   nitroGLYCERIN  (NITROSTAT ) 0.4 MG SL tablet Place 1 tablet (0.4 mg total) under the tongue every 5 (five) minutes as needed for chest pain. 25 tablet 6   omeprazole (PRILOSEC) 20 MG capsule Take 20 mg by mouth 2 (two) times daily before a meal.     rosuvastatin  (CRESTOR ) 5 MG tablet Take 1 tablet (5 mg total) by mouth daily. 90 tablet 3   topiramate  (TOPAMAX ) 50 MG tablet Take 1 tablet (50 mg total) by mouth 2 (two) times daily. 180 tablet 4   Evolocumab  (REPATHA  SURECLICK) 140 MG/ML SOAJ  Inject 140 mg into the skin every 14 (fourteen) days. (Patient not taking: Reported on 06/14/2024) 2 mL 2   isosorbide  mononitrate (IMDUR ) 30 MG 24 hr tablet Take 1 tablet (30 mg total) by mouth daily. 30 tablet 1   No facility-administered medications prior to visit.    PAST MEDICAL HISTORY: Past Medical History:  Diagnosis Date   Allergy Years ago   Pollen, trees, gras, dust   Arthritis 6 years ago   Hands, neck, back, hips, knees   Ectopic atrial rhythm 12/26/2019   Essential (primary) hypertension    Essential hypertension 12/26/2019   Gastro-esophageal reflux disease with esophagitis    GERD (gastroesophageal reflux disease) Years ago   Intervertebral disc disorders with myelopathy of lumbar region    Lipoma of back 06/24/2016   Mixed hyperlipidemia    Nicotine dependence unspecified, with withdrawal    Oxygen deficiency 2023   Use prn with cluster headaches   Precordial pain 12/26/2019   Status post coronary artery stent placement 05/12/2022   STEMI involving left anterior descending coronary artery (HCC) 05/12/2022   Formatting of this note might be different from the original.  DES to LAD and POBA to diagonal by Dr. Toribio.   Tobacco use 12/26/2019    PAST SURGICAL HISTORY: Past Surgical History:  Procedure Laterality  Date   APPENDECTOMY     CHEST SURGERY     Tumor inside chest   CORONARY STENT PLACEMENT  05/12/2022   Xience Skypoint 3.0 mm x 33 mmCSN: [id number] UDI-[id number] REF: [id number], LOT [id number]   HERNIA REPAIR     LEFT HEART CATH AND CORONARY ANGIOGRAPHY N/A 06/20/2024   Procedure: LEFT HEART CATH AND CORONARY ANGIOGRAPHY;  Surgeon: Anner Alm ORN, MD;  [facility] INV[facility]IVE CV LAB;  Service: Cardiovascular;  Laterality: N/A;   LUMBAR DISC SURGERY     X3   SPINE SURGERY  1990s   Has had 3 laminectomies   TUMOR EXCISION     From back    FAMILY HISTORY: Family History  Problem Relation Age of Onset   Hypertension Mother    Arthritis Mother    Diabetes Mother    Kidney disease Mother    Atrial fibrillation Father    Heart disease Father    Headache Neg Hx     SOCIAL HISTORY: Social History   Socioeconomic History   Marital status: Married    Spouse name: Not on file   Number of children: 2   Years of education: Not on file   Highest education level: GED or equivalent  Occupational History   Not on file  Tobacco Use   Smoking status: Every Day    Current packs/day: 0.50    Average packs/day: 0.7 packs/day for 76.5 years (52.0 ttl pk-yrs)    Types: Cigarettes   Smokeless tobacco: Former   Tobacco comments:    Smoked since age 41  Vaping Use   Vaping status: Former  Substance and Sexual Activity   Alcohol use: Not Currently   Drug use: Not Currently    Types: Marijuana   Sexual activity: Yes    Partners: Female    Birth control/protection: Post-menopausal, None  Other Topics Concern   Not on file  Social History Narrative   Pt lives with wife    Disabled   Social Drivers of Health   Financial Resource Strain: High Risk (05/17/2024)   Overall Financial Resource Strain (CARDIA)    Difficulty of Paying Living Expenses: Hard  Food Insecurity: Food Insecurity  Present (05/17/2024)   Hunger Vital Sign    Worried About Running Out of Food in the Last  Year: Sometimes true    Ran Out of Food in the Last Year: Never true  Transportation Needs: No Transportation Needs (05/17/2024)   PRAPARE - Administrator, Civil Service (Medical): No    Lack of Transportation (Non-Medical): No  Physical Activity: Inactive (05/17/2024)   Exercise Vital Sign    Days of Exercise per Week: 0 days    Minutes of Exercise per Session: 0 min  Stress: Stress Concern Present (05/17/2024)   [employer] of Occupational Health - Occupational Stress Questionnaire    Feeling of Stress: Very much  Social Connections: Moderately Integrated (05/17/2024)   Social Connection and Isolation Panel    Frequency of Communication with Friends and Family: More than three times a week    Frequency of Social Gatherings with Friends and Family: Once a week    Attends Religious Services: More than 4 times per year    Active Member of [employer] or Organizations: No    Attends Banker Meetings: Not on file    Marital Status: Married  Catering manager Violence: Not At Risk (05/24/2024)   Humiliation, Afraid, Rape, and Kick questionnaire    Fear of Current or Ex-Partner: No    Emotionally Abused: No    Physically Abused: No    Sexually Abused: No      PHYSICAL EXAM  Vitals:   06/28/24 1306  BP: 126/87  Pulse: 82  Weight: 157 lb (71.2 kg)  Height: 5' 10 (1.778 m)   Body mass index is 22.53 kg/m.  Generalized: Well developed, in no acute distress   Neurological examination  Mentation: Alert oriented to time, place, history taking. Follows all commands speech and language fluent Cranial nerve II-XII: Pupils were equal round reactive to light. Extraocular movements were full, visual field were full on confrontational test. Facial sensation and strength were normal. Uvula tongue midline. Head turning and shoulder shrug  were normal and symmetric. Motor: The motor testing reveals 5 over 5 strength of all 4 extremities. Good symmetric motor tone is  noted throughout.  Sensory: Sensory testing is intact to soft touch on all 4 extremities. No evidence of extinction is noted.  Coordination: Cerebellar testing reveals good finger-nose-finger and heel-to-shin bilaterally.  Gait and station: Gait is normal.  Reflexes: Deep tendon reflexes are symmetric and normal bilaterally.   DIAGNOSTIC DATA (LABS, IMAGING, TESTING) - I reviewed patient records, labs, notes, testing and imaging myself where available.  Lab Results  Component Value Date   WBC 8.0 06/13/2024   HGB 15.3 06/13/2024   HCT 45.9 06/13/2024   MCV 93 06/13/2024   PLT 209 06/13/2024      Component Value Date/Time   NA 139 06/13/2024 0000   K 3.8 06/13/2024 0000   CL 106 06/13/2024 0000   CO2 21 06/13/2024 0000   GLUCOSE 66 (L) 06/13/2024 0000   GLUCOSE 105 (H) 08/08/2022 1335   BUN 11 06/13/2024 0000   CREATININE 0.99 06/13/2024 0000   CALCIUM  8.8 06/13/2024 0000   PROT 7.2 04/30/2024 0926   ALBUMIN 4.7 04/30/2024 0926   AST 14 04/30/2024 0926   ALT 17 04/30/2024 0926   ALKPHOS 108 04/30/2024 0926   BILITOT 0.2 04/30/2024 0926   GFRNONAA >60 08/08/2022 1335   Lab Results  Component Value Date   CHOL 113 02/22/2024   HDL 36 (L) 02/22/2024   LDLCALC 60 02/22/2024  TRIG 88 02/22/2024   CHOLHDL 3.1 02/22/2024   Lab Results  Component Value Date   HGBA1C 5.7 (H) 04/30/2024   No results found for: [id number] Lab Results  Component Value Date   TSH 1.030 07/28/2022      ASSESSMENT AND PLAN 63 y.o. year old male  has a past medical history of Allergy (Years ago), Arthritis (6 years ago), Ectopic atrial rhythm (12/26/2019), Essential (primary) hypertension, Essential hypertension (12/26/2019), Gastro-esophageal reflux disease with esophagitis, GERD (gastroesophageal reflux disease) (Years ago), Intervertebral disc disorders with myelopathy of lumbar region, Lipoma of back (06/24/2016), Mixed hyperlipidemia, Nicotine dependence unspecified, with withdrawal,  Oxygen deficiency (2023), Precordial pain (12/26/2019), Status post coronary artery stent placement (05/12/2022), STEMI involving left anterior descending coronary artery (HCC) (05/12/2022), and Tobacco use (12/26/2019). here with   Cluster headaches Low back pain with peroneal neuropathy  - Continue to monitor symptoms in regards to low back pain and peroneal neuropathy -Currently we do not have any Emgality  samples but we will see if we can get one for him to have on standby should he have a cluster headache - We will refill oxygen for him to use during a cluster cycle - Continue Topamax  50 mg twice a day - Follow-up in 1 year or sooner if needed  Orders Placed This Encounter  Procedures   For home use only DME oxygen    Oxygen for cluster migraines:  high flow mask, 12-15L/min for 15 - 30 minutes acutely(at onset) inhaled oxygen at 100%, portable if possible    Length of Need:   12 Months    Mode or (Route):   Mask    Oxygen delivery system:   Gas      Duwaine Russell, MSN, NP-C 06/28/2024, 1:06 PM [facility] [address], [address] [place], [place] [address] [phone]

## 2024-06-28 NOTE — Patient Instructions (Signed)
 Your Plan:  If we get an Emgality  sample we will call you. Continue Topamax  50 mg twice a day We will refill oxygen    Thank you for coming to see us  at [facility]. I hope we have been able to provide you high quality care today.  You may receive a patient satisfaction survey over the next few weeks. We would appreciate your feedback and comments so that we may continue to improve ourselves and the health of our patients.

## 2024-07-05 ENCOUNTER — Telehealth: Payer: Self-pay | Admitting: *Deleted

## 2024-07-05 NOTE — Telephone Encounter (Signed)
 RE: new order for oxygen / cluster headaches Received: Today Jackson Avelina Neysa Nena GORMAN, RN; Joylene Carlean Jackson Avelina; Tucker, Dolanda; Ziegler, Melissa; 1 other Received, Thank you     Previous Messages    ----- Message ----- From: Neysa Nena GORMAN, RN Sent: 07/05/2024  11:09 AM EDT To: Adine Joylene; Avelina Jackson; Ephraim Dollar* Subject: new order for oxygen / cluster headaches      New order in EPIC for oxygen and cluster headaches.    Kurt Wagner, 63 y.o., Mar 17, 1961 MRN: [id number] Phone: [phone]  Thanks  Thurmon

## 2024-07-05 NOTE — Progress Notes (Signed)
 "      OFFICE NOTE:    Date:  07/06/2024  ID:  Kurt Wagner, DOB 26-Dec-1960, MRN [id number] PCP: Sherre Clapper, MD  [facility] Providers Cardiologist:  Dub Huntsman, DO        Coronary artery disease  MPI 02/05/20: no scar or ischemia, EF 62 Ant STEMI s/p 3 x 33 mm DES to pLAD 04/2022 ([facility]) TTE 10/26/23 ([facility]): EF 55-60, Gr 1 DD, trace MR, trace TR  LHC 06/20/24: LAD ost 30, 40, prox stent patent, mid 40, jailed D1 50; RCA prox 30 NSVT  Ectopic atrial rhythm PSVT Monitor 04/2024: 13 SVT runs (longest 12.2 sec), 1.5% PACs, < 1 % PVCs Hypertension  Hyperlipidemia  GERD        Discussed the use of AI scribe software for clinical note transcription with the patient, who gave verbal consent to proceed. History of Present Illness Kurt Wagner is a 63 y.o. male who returns for post cath follow up. He was seen by Artist Pouch, PA-C 06/13/24 for chest pain responsive to NTG. A hs-Trop was neg. He was set up for cardiac catheterization. This was performed 7/23 and demonstrated a patent LAD stent, 50% stenosis in the jailed D1 and mild to mod nonobstructive CAD elsewhere. Med Rx was continued.   He has not had any new chest symptoms, shortness of breath, syncope, or leg swelling since the procedure. He previously experienced significant muscle pain on atorvastatin 80 mg, leading to a switch to rosuvastatin  5 mg, which he tolerates well. Repatha  was not started due to cost. He has not had palpitations. He smokes cigarettes.   ROS-See HPI    Studies Reviewed:      Results LABS LDL cholesterol: 60 (01/2024)          Physical Exam:  VS:  BP 115/60   Pulse 68   Ht 5' 10 (1.778 m)   Wt 157 lb 6.4 oz (71.4 kg)   SpO2 98%   BMI 22.58 kg/m        Wt Readings from Last 3 Encounters:  07/06/24 157 lb 6.4 oz (71.4 kg)  06/28/24 157 lb (71.2 kg)  06/20/24 156 lb (70.8 kg)    Constitutional:      Appearance: Healthy appearance. Not in distress.  Neck:      Vascular: JVD normal.  Pulmonary:     Breath sounds: Normal breath sounds. No wheezing. No rales.  Cardiovascular:     Normal rate. Regular rhythm.     Murmurs: There is no murmur.     Comments: Right wrist without hematoma Edema:    Peripheral edema absent.  Abdominal:     Palpations: Abdomen is soft.       Assessment and Plan:    Assessment & Plan Coronary artery disease involving native coronary artery of native heart without angina pectoris Hx of anterior STEMI in 2023 tx with DES to LAD at [facility]. He was recently seen for NTG responsive chest pain and cardiac catheterization demonstrated a patent LAD stent. There was mild to mod nonobstructive disease elsewhere. Med Rx was recommended.  Since his cardiac catheterization, he has done well without recurrent chest discomfort or shortness of breath. -Continue ASA 81 mg daily, rosuvastatin  5 mg daily, nitroglycerin  as needed -Follow-up May 2026 as planned Primary hypertension Blood pressure controlled. -Continue diltiazem  240 mg daily Other hyperlipidemia LDL in March 2025 optimal at 60.  He was intolerant to high-dose atorvastatin as well as simvastatin  past secondary to statin myopathy.  He has been tolerating rosuvastatin  5 mg daily.  He could not afford Repatha . -Continue ezetimibe  10 mg daily, rosuvastatin  5 mg daily -Arrange fasting lipids and ALT in 2 months PSVT (paroxysmal supraventricular tachycardia) (HCC) Asymptomatic.  Continue diltiazem  240 mg daily        Dispo:  Return in about 9 months (around 04/05/2025) for Routine Follow Up with Dr. Sheena.  Signed, Glendia Ferrier, PA-C   "

## 2024-07-05 NOTE — Telephone Encounter (Signed)
 I called pt and let him know that MM/NP did order the oxygen for cluster headaches. 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  I reviewed chart but was not listed that I could see.  I called and spoke to Shay with [employer]. (CSR).  Last time received order was 07-14-2023 ([place] and [place]).  I have sent order to [employer] this am.

## 2024-07-05 NOTE — Addendum Note (Signed)
 Addended by: SHERRYL DUWAINE SQUIBB on: 07/05/2024 09:06 AM   Modules accepted: Orders

## 2024-07-05 NOTE — Telephone Encounter (Signed)
-----   Message from [facility] sent at 07/05/2024  9:06 AM EDT ----- Please let patient know order for oxygen has been placed

## 2024-07-05 NOTE — Assessment & Plan Note (Signed)
 Hx of anterior STEMI in 2023 tx with DES to LAD at [facility]. He was recently seen for NTG responsive chest pain and cardiac catheterization demonstrated a patent LAD stent. There was mild to mod nonobstructive disease elsewhere. Med Rx was recommended.

## 2024-07-06 ENCOUNTER — Encounter: Payer: Self-pay | Admitting: Physician Assistant

## 2024-07-06 ENCOUNTER — Ambulatory Visit: Attending: Physician Assistant | Admitting: Physician Assistant

## 2024-07-06 VITALS — BP 115/60 | HR 68 | Ht 70.0 in | Wt 157.4 lb

## 2024-07-06 DIAGNOSIS — I471 Supraventricular tachycardia, unspecified: Secondary | ICD-10-CM | POA: Insufficient documentation

## 2024-07-06 DIAGNOSIS — E7849 Other hyperlipidemia: Secondary | ICD-10-CM | POA: Insufficient documentation

## 2024-07-06 DIAGNOSIS — I2510 Atherosclerotic heart disease of native coronary artery without angina pectoris: Secondary | ICD-10-CM | POA: Diagnosis not present

## 2024-07-06 DIAGNOSIS — I10 Essential (primary) hypertension: Secondary | ICD-10-CM | POA: Insufficient documentation

## 2024-07-06 NOTE — Patient Instructions (Signed)
 Medication Instructions:  Your physician recommends that you continue on your current medications as directed. Please refer to the Current Medication list given to you today.  *If you need a refill on your cardiac medications before your next appointment, please call your pharmacy*  Lab Work: GO TO ANY [facility] NEAR YOU IN [place], FASTING, FOR:  LIPID & ALT  If you have labs (blood work) drawn today and your tests are completely normal, you will receive your results only by: MyChart Message (if you have MyChart) OR A paper copy in the mail If you have any lab test that is abnormal or we need to change your treatment, we will call you to review the results.  Testing/Procedures: None ordered  Follow-Up: At [facility], you and your health needs are our priority.  As part of our continuing mission to provide you with exceptional heart care, our providers are all part of one team.  This team includes your primary Cardiologist (physician) and Advanced Practice Providers or APPs (Physician Assistants and Nurse Practitioners) who all work together to provide you with the care you need, when you need it.  Your next appointment:   9 month(s)  Provider:   Kardie Tobb, DO    We recommend signing up for the patient portal called MyChart.  Sign up information is provided on this After Visit Summary.  MyChart is used to connect with patients for Virtual Visits (Telemedicine).  Patients are able to view lab/test results, encounter notes, upcoming appointments, etc.  Non-urgent messages can be sent to your provider as well.   To learn more about what you can do with MyChart, go to [email or web address].   Other Instructions

## 2024-07-06 NOTE — Assessment & Plan Note (Signed)
 LDL in March 2025 optimal at 60.  He was intolerant to high-dose atorvastatin as well as simvastatin past secondary to statin myopathy.  He has been tolerating rosuvastatin  5 mg daily.  He could not afford Repatha . -Continue ezetimibe  10 mg daily, rosuvastatin  5 mg daily -Arrange fasting lipids and ALT in 2 months

## 2024-07-06 NOTE — Assessment & Plan Note (Signed)
 Blood pressure controlled. -Continue diltiazem  240 mg daily

## 2024-07-09 ENCOUNTER — Encounter: Payer: Self-pay | Admitting: Gastroenterology

## 2024-08-29 ENCOUNTER — Ambulatory Visit (AMBULATORY_SURGERY_CENTER)

## 2024-08-29 VITALS — Ht 70.0 in | Wt 155.0 lb

## 2024-08-29 DIAGNOSIS — Z860100 Personal history of colon polyps, unspecified: Secondary | ICD-10-CM

## 2024-08-29 MED ORDER — PEG 3350-KCL-NA BICARB-NACL 420 G PO SOLR: 4000.0000 mL | Freq: Once | ORAL | 0 refills | Status: AC

## 2024-08-29 NOTE — Progress Notes (Signed)
 No egg or soy allergy known to patient  No issues known to pt with past sedation with any surgeries or procedures Patient denies ever being told they had issues or difficulty with intubation  No FH of Malignant Hyperthermia Pt is not on diet pills Pt is not on  home 02  Pt is not on blood thinners  Pt denies issues with constipation  No A fib or A flutter Have any cardiac testing pending-- no  LOA: independent  Prep: Golytely   Patient's chart reviewed by Norleen Schillings CNRA prior to previsit and patient appropriate for the [facility].  Previsit completed and red dot placed by patient's name on their procedure day (on provider's schedule).     PV completed with patient. Prep instructions sent via mychart and home address.

## 2024-09-06 NOTE — Assessment & Plan Note (Deleted)
 SABRA

## 2024-09-06 NOTE — Progress Notes (Deleted)
 Subjective:  Patient ID: Kurt Wagner, male    DOB: June 27, 1961  Age: 63 y.o. MRN: [id number]  No chief complaint on file.   HPI: Discussed the use of AI scribe software for clinical note transcription with the patient, who gave verbal consent to proceed.  History of Present Illness        05/24/2024    8:40 AM 09/06/2023    7:54 AM 05/18/2023    9:04 AM 05/24/2022    2:27 PM 01/07/2021    7:39 AM  Depression screen PHQ 2/9  Decreased Interest 0 0 0 0 0  Down, Depressed, Hopeless 0 0 0 0 0  PHQ - 2 Score 0 0 0 0 0  Altered sleeping  3     Tired, decreased energy  3     Change in appetite  0     Feeling bad or failure about yourself   0     Trouble concentrating  0     Moving slowly or fidgety/restless  0     Suicidal thoughts  0     PHQ-9 Score  6     Difficult doing work/chores  Not difficult at all           05/17/2024    9:33 AM  Fall Risk   Falls in the past year? 0  Number falls in past yr: 0  Injury with Fall? 0  Risk for fall due to : No Fall Risks  Follow up Education provided;Falls prevention discussed;Falls evaluation completed    Patient Care Team: Cabell Lazenby, Abigail, MD as PCP - General (Family Medicine) Tobb, Kardie, DO as PCP - Cardiology (Cardiology) Rush Nest, MD (Inactive) (Neurology) Vannie Elsie HERO, OD (Ophthalmology)   Review of Systems  Current Outpatient Medications on File Prior to Visit  Medication Sig Dispense Refill   ASPIRIN  81 PO Take 81 mg by mouth daily.     diltiazem  (CARDIZEM  CD) 240 MG 24 hr capsule Take 1 capsule (240 mg total) by mouth daily. 90 capsule 3   diphenhydrAMINE  (BENADRYL ) 25 mg capsule Take 25 mg by mouth 2 (two) times daily as needed for allergies.     ezetimibe  (ZETIA ) 10 MG tablet Take 1 tablet (10 mg total) by mouth daily. 90 tablet 3   meclizine  (ANTIVERT ) 25 MG tablet Take 1 tablet (25 mg total) by mouth 3 (three) times daily as needed for dizziness. (Patient not taking: Reported on 08/29/2024) 30 tablet 0    nitroGLYCERIN  (NITROSTAT ) 0.4 MG SL tablet Place 1 tablet (0.4 mg total) under the tongue every 5 (five) minutes as needed for chest pain. 25 tablet 6   omeprazole (PRILOSEC) 20 MG capsule Take 20 mg by mouth 2 (two) times daily before a meal.     topiramate  (TOPAMAX ) 50 MG tablet Take 1 tablet (50 mg total) by mouth 2 (two) times daily. 180 tablet 4   No current facility-administered medications on file prior to visit.   Past Medical History:  Diagnosis Date   Allergy Years ago   Pollen, trees, gras, dust   Arthritis 6 years ago   Hands, neck, back, hips, knees   Ectopic atrial rhythm 12/26/2019   Essential (primary) hypertension    Essential hypertension 12/26/2019   Gastro-esophageal reflux disease with esophagitis    GERD (gastroesophageal reflux disease) Years ago   Intervertebral disc disorders with myelopathy of lumbar region    Lipoma of back 06/24/2016   Mixed hyperlipidemia    Nicotine dependence unspecified, with  withdrawal    Oxygen deficiency 2023   Use prn with cluster headaches   Precordial pain 12/26/2019   Status post coronary artery stent placement 05/12/2022   STEMI involving left anterior descending coronary artery (HCC) 05/12/2022   Formatting of this note might be different from the original.  DES to LAD and POBA to diagonal by Dr. Toribio.   Tobacco use 12/26/2019   Past Surgical History:  Procedure Laterality Date   APPENDECTOMY     CHEST SURGERY     Tumor inside chest   CORONARY STENT PLACEMENT  05/12/2022   Xience Skypoint 3.0 mm x 33 mmCSN: [id number] UDI-[id number] REF: [id number], LOT [id number]   HERNIA REPAIR     LEFT HEART CATH AND CORONARY ANGIOGRAPHY N/A 06/20/2024   Procedure: LEFT HEART CATH AND CORONARY ANGIOGRAPHY;  Surgeon: Anner Alm ORN, MD;  Location: [facility] INVASIVE CV LAB;  Service: Cardiovascular;  Laterality: N/A;   LUMBAR DISC SURGERY     X3   SPINE SURGERY  1990s   Has had 3 laminectomies   TUMOR EXCISION     From  back    Family History  Problem Relation Age of Onset   Hypertension Mother    Arthritis Mother    Diabetes Mother    Kidney disease Mother    Atrial fibrillation Father    Heart disease Father    Headache Neg Hx    Colon cancer Neg Hx    Rectal cancer Neg Hx    Stomach cancer Neg Hx    Social History   Socioeconomic History   Marital status: Married    Spouse name: Not on file   Number of children: 2   Years of education: Not on file   Highest education level: GED or equivalent  Occupational History   Not on file  Tobacco Use   Smoking status: Every Day    Current packs/day: 0.50    Average packs/day: 0.7 packs/day for 76.5 years (52.0 ttl pk-yrs)    Types: Cigarettes   Smokeless tobacco: Former   Tobacco comments:    Smoked since age 52  Vaping Use   Vaping status: Former  Substance and Sexual Activity   Alcohol use: Not Currently   Drug use: Not Currently    Types: Marijuana   Sexual activity: Yes    Partners: Female    Birth control/protection: Post-menopausal, None  Other Topics Concern   Not on file  Social History Narrative   Pt lives with wife    Disabled   Social Drivers of Health   Financial Resource Strain: High Risk (05/17/2024)   Overall Financial Resource Strain (CARDIA)    Difficulty of Paying Living Expenses: Hard  Food Insecurity: Food Insecurity Present (05/17/2024)   Hunger Vital Sign    Worried About Running Out of Food in the Last Year: Sometimes true    Ran Out of Food in the Last Year: Never true  Transportation Needs: No Transportation Needs (05/17/2024)   PRAPARE - Administrator, Civil Service (Medical): No    Lack of Transportation (Non-Medical): No  Physical Activity: Inactive (05/17/2024)   Exercise Vital Sign    Days of Exercise per Week: 0 days    Minutes of Exercise per Session: 0 min  Stress: Stress Concern Present (05/17/2024)   [employer] of Occupational Health - Occupational Stress Questionnaire     Feeling of Stress: Very much  Social Connections: Moderately Integrated (05/17/2024)   Social Connection and Isolation Panel  Frequency of Communication with Friends and Family: More than three times a week    Frequency of Social Gatherings with Friends and Family: Once a week    Attends Religious Services: More than 4 times per year    Active Member of [employer] or Organizations: No    Attends Engineer, structural: Not on file    Marital Status: Married    Objective:  There were no vitals taken for this visit.     08/29/2024    8:02 AM 07/06/2024    8:36 AM 06/28/2024    1:06 PM  BP/Weight  Systolic BP  115 873  Diastolic BP  60 87  Wt. (Lbs) 155 157.4 157  BMI 22.24 kg/m2 22.58 kg/m2 22.53 kg/m2    Physical Exam  {Perform Simple Foot Exam  Perform Detailed exam:1} {Insert foot Exam (Optional):30965}   Lab Results  Component Value Date   WBC 8.0 06/13/2024   HGB 15.3 06/13/2024   HCT 45.9 06/13/2024   PLT 209 06/13/2024   GLUCOSE 66 (L) 06/13/2024   CHOL 113 02/22/2024   TRIG 88 02/22/2024   HDL 36 (L) 02/22/2024   LDLCALC 60 02/22/2024   ALT 17 04/30/2024   AST 14 04/30/2024   NA 139 06/13/2024   K 3.8 06/13/2024   CL 106 06/13/2024   CREATININE 0.99 06/13/2024   BUN 11 06/13/2024   CO2 21 06/13/2024   TSH 1.030 07/28/2022   HGBA1C 5.7 (H) 04/30/2024    Results for orders placed or performed in visit on 06/13/24  Basic Metabolic Panel (BMET)   Collection Time: 06/13/24 12:00 AM  Result Value Ref Range   Glucose 66 (L) 70 - 99 mg/dL   BUN 11 8 - 27 mg/dL   Creatinine, Ser 9.00 0.76 - 1.27 mg/dL   eGFR 87 >40 fO/fpw/8.26   BUN/Creatinine Ratio 11 10 - 24   Sodium 139 134 - 144 mmol/L   Potassium 3.8 3.5 - 5.2 mmol/L   Chloride 106 96 - 106 mmol/L   CO2 21 20 - 29 mmol/L   Calcium  8.8 8.6 - 10.2 mg/dL  CBC   Collection Time: 06/13/24 12:00 AM  Result Value Ref Range   WBC 8.0 3.4 - 10.8 x10E3/uL   RBC 4.93 4.14 - 5.80 x10E6/uL   Hemoglobin  15.3 13.0 - 17.7 g/dL   Hematocrit 54.0 62.4 - 51.0 %   MCV 93 79 - 97 fL   MCH 31.0 26.6 - 33.0 pg   MCHC 33.3 31.5 - 35.7 g/dL   RDW 86.1 88.3 - 84.5 %   Platelets 209 150 - 450 x10E3/uL  Troponin T, STAT ([facility])   Collection Time: 06/13/24 12:00 AM  Result Value Ref Range   Troponin T (Highly Sensitive) <6 0 - 22 ng/L  .  Assessment & Plan:   Assessment & Plan Primary hypertension     Mixed hyperlipidemia     Prediabetes       There is no height or weight on file to calculate BMI.  Assessment and Plan Assessment & Plan      No orders of the defined types were placed in this encounter.   No orders of the defined types were placed in this encounter.      Follow-up: No follow-ups on file.  An After Visit Summary was printed and given to the patient.  Abigail Free, MD [facility] [phone]

## 2024-09-06 NOTE — Assessment & Plan Note (Deleted)
 Kurt Wagner

## 2024-09-07 ENCOUNTER — Encounter: Payer: Self-pay | Admitting: Family Medicine

## 2024-09-07 ENCOUNTER — Ambulatory Visit: Admitting: Family Medicine

## 2024-09-07 DIAGNOSIS — E782 Mixed hyperlipidemia: Secondary | ICD-10-CM

## 2024-09-07 DIAGNOSIS — R7303 Prediabetes: Secondary | ICD-10-CM

## 2024-09-07 DIAGNOSIS — I10 Essential (primary) hypertension: Secondary | ICD-10-CM

## 2024-09-07 NOTE — Telephone Encounter (Signed)
 Left message for patient to return call.

## 2024-09-07 NOTE — Telephone Encounter (Signed)
 Tried calling patient to let him know that Dr. Sherre told the delivery guy to come on back with the smoothies because they were a line at check in, that he was not a patient and to ask patient if he wanted to reschedule.

## 2024-09-14 ENCOUNTER — Ambulatory Visit (AMBULATORY_SURGERY_CENTER): Admitting: Gastroenterology

## 2024-09-14 ENCOUNTER — Encounter: Payer: Self-pay | Admitting: Gastroenterology

## 2024-09-14 VITALS — BP 121/82 | HR 65 | Temp 98.6°F | Resp 18 | Ht 70.0 in | Wt 155.0 lb

## 2024-09-14 DIAGNOSIS — Z860100 Personal history of colon polyps, unspecified: Secondary | ICD-10-CM

## 2024-09-14 DIAGNOSIS — Q439 Congenital malformation of intestine, unspecified: Secondary | ICD-10-CM | POA: Diagnosis not present

## 2024-09-14 DIAGNOSIS — K635 Polyp of colon: Secondary | ICD-10-CM | POA: Diagnosis not present

## 2024-09-14 DIAGNOSIS — D128 Benign neoplasm of rectum: Secondary | ICD-10-CM | POA: Diagnosis not present

## 2024-09-14 DIAGNOSIS — D124 Benign neoplasm of descending colon: Secondary | ICD-10-CM | POA: Diagnosis not present

## 2024-09-14 DIAGNOSIS — K621 Rectal polyp: Secondary | ICD-10-CM | POA: Diagnosis not present

## 2024-09-14 DIAGNOSIS — Z1211 Encounter for screening for malignant neoplasm of colon: Secondary | ICD-10-CM

## 2024-09-14 DIAGNOSIS — K648 Other hemorrhoids: Secondary | ICD-10-CM

## 2024-09-14 DIAGNOSIS — K644 Residual hemorrhoidal skin tags: Secondary | ICD-10-CM

## 2024-09-14 DIAGNOSIS — E7849 Other hyperlipidemia: Secondary | ICD-10-CM | POA: Diagnosis not present

## 2024-09-14 DIAGNOSIS — D125 Benign neoplasm of sigmoid colon: Secondary | ICD-10-CM | POA: Diagnosis not present

## 2024-09-14 DIAGNOSIS — I10 Essential (primary) hypertension: Secondary | ICD-10-CM | POA: Diagnosis not present

## 2024-09-14 DIAGNOSIS — D123 Benign neoplasm of transverse colon: Secondary | ICD-10-CM | POA: Diagnosis not present

## 2024-09-14 MED ORDER — SODIUM CHLORIDE 0.9 % IV SOLN: 500.0000 mL | INTRAVENOUS | Status: DC

## 2024-09-14 NOTE — Progress Notes (Signed)
 Pt's states no medical or surgical changes since previsit or office visit.

## 2024-09-14 NOTE — Progress Notes (Signed)
 A/o x 3, VSS, good SR's, pleased with anesthesia, report to RN

## 2024-09-14 NOTE — Progress Notes (Signed)
 [facility] History and Physical   Primary Care Physician:  Sherre Clapper, MD   Reason for Procedure:   H/O polyps  Plan:    colon     HPI: Kurt Wagner is a 63 y.o. male    Past Medical History:  Diagnosis Date   Allergy Years ago   Pollen, trees, gras, dust   Arthritis 6 years ago   Hands, neck, back, hips, knees   Ectopic atrial rhythm 12/26/2019   Essential (primary) hypertension    Essential hypertension 12/26/2019   Gastro-esophageal reflux disease with esophagitis    GERD (gastroesophageal reflux disease) Years ago   Intervertebral disc disorders with myelopathy of lumbar region    Lipoma of back 06/24/2016   Mixed hyperlipidemia    Nicotine dependence unspecified, with withdrawal    Oxygen deficiency 2023   Use prn with cluster headaches   Precordial pain 12/26/2019   Status post coronary artery stent placement 05/12/2022   STEMI involving left anterior descending coronary artery (HCC) 05/12/2022   Formatting of this note might be different from the original.  DES to LAD and POBA to diagonal by Dr. Toribio.   Tobacco use 12/26/2019    Past Surgical History:  Procedure Laterality Date   APPENDECTOMY     CHEST SURGERY     Tumor inside chest   COLONOSCOPY     CORONARY STENT PLACEMENT  05/12/2022   Xience Skypoint 3.0 mm x 33 mmCSN: [id number] UDI-[id number] REF: [id number], LOT [id number]   HERNIA REPAIR     LEFT HEART CATH AND CORONARY ANGIOGRAPHY N/A 06/20/2024   Procedure: LEFT HEART CATH AND CORONARY ANGIOGRAPHY;  Surgeon: Anner Alm ORN, MD;  [facility];  Service: Cardiovascular;  Laterality: N/A;   LUMBAR DISC SURGERY     X3   SPINE SURGERY  1990s   Has had 3 laminectomies   TUMOR EXCISION     From back    Prior to Admission medications   Medication Sig Start Date End Date Taking? Authorizing Provider  ASPIRIN  81 PO Take 81 mg by mouth daily.   Yes [provider]  diltiazem  (CARDIZEM  CD) 240  MG 24 hr capsule Take 1 capsule (240 mg total) by mouth daily. 04/27/24 09/14/24 Yes Tobb, Kardie, DO  diphenhydrAMINE  (BENADRYL ) 25 mg capsule Take 25 mg by mouth 2 (two) times daily as needed for allergies.   Yes [provider]  ezetimibe  (ZETIA ) 10 MG tablet Take 1 tablet (10 mg total) by mouth daily. 04/27/24 09/14/24 Yes Tobb, Kardie, DO  omeprazole (PRILOSEC) 20 MG capsule Take 20 mg by mouth 2 (two) times daily before a meal.   Yes [provider]  topiramate  (TOPAMAX ) 50 MG tablet Take 1 tablet (50 mg total) by mouth 2 (two) times daily. 12/12/23  Yes Ines Onetha NOVAK, MD  meclizine  (ANTIVERT ) 25 MG tablet Take 1 tablet (25 mg total) by mouth 3 (three) times daily as needed for dizziness. Patient not taking: No sig reported 05/18/23   CoxClapper, MD  nitroGLYCERIN  (NITROSTAT ) 0.4 MG SL tablet Place 1 tablet (0.4 mg total) under the tongue every 5 (five) minutes as needed for chest pain. 04/27/24   Tobb, Kardie, DO    Current Outpatient Medications  Medication Sig Dispense Refill   ASPIRIN  81 PO Take 81 mg by mouth daily.     diltiazem  (CARDIZEM  CD) 240 MG 24 hr capsule Take 1 capsule (240 mg total) by mouth daily. 90 capsule 3   diphenhydrAMINE  (BENADRYL )  25 mg capsule Take 25 mg by mouth 2 (two) times daily as needed for allergies.     ezetimibe  (ZETIA ) 10 MG tablet Take 1 tablet (10 mg total) by mouth daily. 90 tablet 3   omeprazole (PRILOSEC) 20 MG capsule Take 20 mg by mouth 2 (two) times daily before a meal.     topiramate  (TOPAMAX ) 50 MG tablet Take 1 tablet (50 mg total) by mouth 2 (two) times daily. 180 tablet 4   meclizine  (ANTIVERT ) 25 MG tablet Take 1 tablet (25 mg total) by mouth 3 (three) times daily as needed for dizziness. (Patient not taking: No sig reported) 30 tablet 0   nitroGLYCERIN  (NITROSTAT ) 0.4 MG SL tablet Place 1 tablet (0.4 mg total) under the tongue every 5 (five) minutes as needed for chest pain. 25 tablet 6   No current facility-administered  medications for this visit.    Allergies as of 09/14/2024 - Review Complete 09/14/2024  Allergen Reaction Noted   Fentanyl  Other (See Comments) 05/12/2022   Azithromycin Other (See Comments) 05/12/2022   Simvastatin Other (See Comments) 05/12/2022   Tape Other (See Comments) 12/26/2019   Varenicline Other (See Comments) 05/12/2022   Wound dressing adhesive Other (See Comments) 12/26/2019    Family History  Problem Relation Age of Onset   Hypertension Mother    Arthritis Mother    Diabetes Mother    Kidney disease Mother    Atrial fibrillation Father    Heart disease Father    Headache Neg Hx    Colon cancer Neg Hx    Rectal cancer Neg Hx    Stomach cancer Neg Hx    Esophageal cancer Neg Hx     Social History   Socioeconomic History   Marital status: Married    Spouse name: Not on file   Number of children: 2   Years of education: Not on file   Highest education level: GED or equivalent  Occupational History   Not on file  Tobacco Use   Smoking status: Every Day    Current packs/day: 0.50    Average packs/day: 0.7 packs/day for 76.5 years (52.0 ttl pk-yrs)    Types: Cigarettes   Smokeless tobacco: Former   Tobacco comments:    Smoked since age 84  Vaping Use   Vaping status: Former  Substance and Sexual Activity   Alcohol use: Not Currently   Drug use: Not Currently    Types: Marijuana    Comment: Last marijuana use 09/13/2024   Sexual activity: Yes    Partners: Female    Birth control/protection: Post-menopausal, None  Other Topics Concern   Not on file  Social History Narrative   Pt lives with wife    Disabled   Social Drivers of Health   Financial Resource Strain: High Risk (05/17/2024)   Overall Financial Resource Strain (CARDIA)    Difficulty of Paying Living Expenses: Hard  Food Insecurity: Food Insecurity Present (05/17/2024)   Hunger Vital Sign    Worried About Running Out of Food in the Last Year: Sometimes true    Ran Out of Food in the  Last Year: Never true  Transportation Needs: No Transportation Needs (05/17/2024)   PRAPARE - Administrator, Civil Service (Medical): No    Lack of Transportation (Non-Medical): No  Physical Activity: Inactive (05/17/2024)   Exercise Vital Sign    Days of Exercise per Week: 0 days    Minutes of Exercise per Session: 0 min  Stress: Stress Concern  Present (05/17/2024)   [employer] of Occupational Health - Occupational Stress Questionnaire    Feeling of Stress: Very much  Social Connections: Moderately Integrated (05/17/2024)   Social Connection and Isolation Panel    Frequency of Communication with Friends and Family: More than three times a week    Frequency of Social Gatherings with Friends and Family: Once a week    Attends Religious Services: More than 4 times per year    Active Member of [employer] or Organizations: No    Attends Engineer, structural: Not on file    Marital Status: Married  Catering manager Violence: Not At Risk (05/24/2024)   Humiliation, Afraid, Rape, and Kick questionnaire    Fear of Current or Ex-Partner: No    Emotionally Abused: No    Physically Abused: No    Sexually Abused: No    Review of Systems: Positive for none All other review of systems negative except as mentioned in the HPI.  Physical Exam: Vital signs in last 24 hours: @VSRANGES @   General:   Alert,  Well-developed, well-nourished, pleasant and cooperative in NAD Lungs:  Clear throughout to auscultation.   Heart:  Regular rate and rhythm; no murmurs, clicks, rubs,  or gallops. Abdomen:  Soft, nontender and nondistended. Normal bowel sounds.   Neuro/Psych:  Alert and cooperative. Normal mood and affect. A and O x 3    No significant changes were identified.  The patient continues to be an appropriate candidate for the planned procedure and anesthesia.   Anselm Bring, MD. [facility] 09/14/2024 3:58 PM@

## 2024-09-14 NOTE — Patient Instructions (Addendum)
 Resume previous diet.                           - Continue present medications.                           - Await pathology results.                           - Repeat colonoscopy for surveillance based on                            pathology results.                           - The findings and recommendations were discussed                            with the patient's family. Handout on polyps given.     YOU HAD AN ENDOSCOPIC PROCEDURE TODAY AT THE [facility]:   Refer to the procedure report that was given to you for any specific questions about what was found during the examination.  If the procedure report does not answer your questions, please call your gastroenterologist to clarify.  If you requested that your care partner not be given the details of your procedure findings, then the procedure report has been included in a sealed envelope for you to review at your convenience later.  YOU SHOULD EXPECT: Some feelings of bloating in the abdomen. Passage of more gas than usual.  Walking can help get rid of the air that was put into your GI tract during the procedure and reduce the bloating. If you had a lower endoscopy (such as a colonoscopy or flexible sigmoidoscopy) you may notice spotting of blood in your stool or on the toilet paper. If you underwent a bowel prep for your procedure, you may not have a normal bowel movement for a few days.  Please Note:  You might notice some irritation and congestion in your nose or some drainage.  This is from the oxygen used during your procedure.  There is no need for concern and it should clear up in a day or so.  SYMPTOMS TO REPORT IMMEDIATELY:  Following lower endoscopy (colonoscopy or flexible sigmoidoscopy):  Excessive amounts of blood in the stool  Significant tenderness or worsening of abdominal pains  Swelling of the abdomen that is new, acute  Fever of 100F or higher   For urgent or emergent issues, a gastroenterologist  can be reached at any hour by calling [phone]. Do not use MyChart messaging for urgent concerns.    DIET:  We do recommend a small meal at first, but then you may proceed to your regular diet.  Drink plenty of fluids but you should avoid alcoholic beverages for 24 hours.  ACTIVITY:  You should plan to take it easy for the rest of today and you should NOT DRIVE or use heavy machinery until tomorrow (because of the sedation medicines used during the test).    FOLLOW UP: Our staff will call the number listed on your records the next business day following your procedure.  We will call around 7:15- 8:00 am to check on you and address any questions or concerns that you  may have regarding the information given to you following your procedure. If we do not reach you, we will leave a message.     If any biopsies were taken you will be contacted by phone or by letter within the next 1-3 weeks.  Please call us  at [phone] if you have not heard about the biopsies in 3 weeks.    SIGNATURES/CONFIDENTIALITY: You and/or your care partner have signed paperwork which will be entered into your electronic medical record.  These signatures attest to the fact that that the information above on your After Visit Summary has been reviewed and is understood.  Full responsibility of the confidentiality of this discharge information lies with you and/or your care-partner.

## 2024-09-14 NOTE — Op Note (Signed)
 [facility] Patient Name: Kurt Wagner Procedure Date: 09/14/2024 10:35 AM MRN: [id number] Endoscopist: Lynnie Bring , MD, [id number] Age: 63 Referring MD:  Date of Birth: 1960/12/30 Gender: Male Account #: [id number] Procedure:                Colonoscopy Indications:              High risk colon cancer surveillance: Personal                            history of colonic polyps Medicines:                Monitored Anesthesia Care Procedure:                Pre-Anesthesia Assessment:                           - Prior to the procedure, a History and Physical                            was performed, and patient medications and                            allergies were reviewed. The patient's tolerance of                            previous anesthesia was also reviewed. The risks                            and benefits of the procedure and the sedation                            options and risks were discussed with the patient.                            All questions were answered, and informed consent                            was obtained. Prior Anticoagulants: The patient has                            taken no anticoagulant or antiplatelet agents. ASA                            Grade Assessment: II - A patient with mild systemic                            disease. After reviewing the risks and benefits,                            the patient was deemed in satisfactory condition to                            undergo the procedure.  After obtaining informed consent, the colonoscope                            was passed under direct vision. Throughout the                            procedure, the patient's blood pressure, pulse, and                            oxygen saturations were monitored continuously. The                            Olympus Scope CF [id number] was introduced through                            the anus and advanced to the the  cecum, identified                            by appendiceal orifice and ileocecal valve. The                            colonoscopy was somewhat difficult due to a                            tortuous colon. Successful completion of the                            procedure was aided by applying abdominal pressure.                            The patient tolerated the procedure well. The                            quality of the bowel preparation was adequate to                            identify polyps. The ileocecal valve, appendiceal                            orifice, and rectum were photographed. Scope In: 10:57:14 AM Scope Out: 11:27:49 AM Scope Withdrawal Time: 0 hours 23 minutes 42 seconds  Total Procedure Duration: 0 hours 30 minutes 35 seconds  Findings:                 Five sessile polyps were found in the proximal                            transverse colon, mid transverse colon and distal                            transverse colon. The polyps were 6 to 8 mm in                            size. These  polyps were removed with a cold snare.                            Resection and retrieval were complete.                           Four sessile polyps were found in the mid sigmoid                            colon, distal sigmoid colon and mid descending                            colon. The polyps were 6 to 7 mm in size. These                            polyps were removed with a cold snare. Resection                            and retrieval were complete.                           Two sessile polyps were found in the rectum. The                            polyps were 5 to 6 mm in size. These polyps were                            removed with a cold snare. Resection and retrieval                            were complete.                           Non-bleeding external and internal hemorrhoids were                            found during retroflexion and during perianal exam.                             The hemorrhoids were small.                           The exam was otherwise without abnormality on                            direct and retroflexion views. Few hyperplastic                            appearing polyps on NBI were visualized in the                            distal sigmoid colon and in the proximal rectum.  Not removed. Complications:            No immediate complications. Estimated Blood Loss:     Estimated blood loss: none. Impression:               - Five 6 to 8 mm polyps in the proximal transverse                            colon, in the mid transverse colon and in the                            distal transverse colon, removed with a cold snare.                            Resected and retrieved.                           - Four 6 to 7 mm polyps in the mid sigmoid colon,                            in the distal sigmoid colon and in the mid                            descending colon, removed with a cold snare.                            Resected and retrieved.                           - Two 5 to 6 mm polyps in the rectum, removed with                            a cold snare. Resected and retrieved.                           - Non-bleeding external and internal hemorrhoids.                           - The examination was otherwise normal on direct                            and retroflexion views. Recommendation:           - Patient has a contact number available for                            emergencies. The signs and symptoms of potential                            delayed complications were discussed with the                            patient. Return to normal activities tomorrow.  Written discharge instructions were provided to the                            patient.                           - Resume previous diet.                           - Continue present medications.                            - Await pathology results.                           - Repeat colonoscopy for surveillance based on                            pathology results.                           - The findings and recommendations were discussed                            with the patient's family. Lynnie Bring, MD 09/14/2024 11:34:09 AM This report has been signed electronically.

## 2024-09-14 NOTE — Progress Notes (Signed)
 Called to room to assist during endoscopic procedure.  Patient ID and intended procedure confirmed with present staff. Received instructions for my participation in the procedure from the performing physician.

## 2024-09-17 ENCOUNTER — Telehealth: Payer: Self-pay

## 2024-09-17 DIAGNOSIS — I2510 Atherosclerotic heart disease of native coronary artery without angina pectoris: Secondary | ICD-10-CM | POA: Diagnosis not present

## 2024-09-17 DIAGNOSIS — E7849 Other hyperlipidemia: Secondary | ICD-10-CM | POA: Diagnosis not present

## 2024-09-17 NOTE — Telephone Encounter (Signed)
  Follow up Call-     09/14/2024    9:54 AM  Call back number  Post procedure Call Back phone  # [phone]  Permission to leave phone message Yes     Patient questions:  Do you have a fever, pain , or abdominal swelling? No. Pain Score  0 *  Have you tolerated food without any problems? Yes.    Have you been able to return to your normal activities? Yes.    Do you have any questions about your discharge instructions: Diet   No. Medications  No. Follow up visit  No.  Do you have questions or concerns about your Care? No.  Actions: * If pain score is 4 or above: No action needed, pain <4.

## 2024-09-18 ENCOUNTER — Ambulatory Visit: Payer: Self-pay | Admitting: Physician Assistant

## 2024-09-18 LAB — LIPID PANEL
Chol/HDL Ratio: 6.3 ratio — ABNORMAL HIGH (ref 0.0–5.0)
Cholesterol, Total: 225 mg/dL — ABNORMAL HIGH (ref 100–199)
HDL: 36 mg/dL — ABNORMAL LOW (ref >39)
LDL Chol Calc (NIH): 165 mg/dL — ABNORMAL HIGH (ref 0–99)
Triglycerides: 132 mg/dL (ref 0–149)
VLDL Cholesterol Cal: 24 mg/dL (ref 5–40)

## 2024-09-18 LAB — SURGICAL PATHOLOGY

## 2024-09-18 LAB — ALT: ALT: 9 IU/L (ref 0–44)

## 2024-09-22 ENCOUNTER — Ambulatory Visit: Payer: Self-pay | Admitting: Gastroenterology

## 2024-12-12 ENCOUNTER — Telehealth: Payer: Self-pay | Admitting: Pharmacy Technician

## 2024-12-12 ENCOUNTER — Other Ambulatory Visit (HOSPITAL_COMMUNITY): Payer: Self-pay

## 2024-12-12 NOTE — Telephone Encounter (Signed)
-----   Message from Glendia Ferrier, [place] sent at 12/11/2024  1:39 PM EST ----- Hi! I had saved this in my in basket. Looks like we were waiting until the new year to try to get the Repatha  restarted. Scott

## 2024-12-12 NOTE — Telephone Encounter (Signed)
 Sent [employer] application to see what all he needs now since we had application from July      He does not have insurance

## 2024-12-20 NOTE — Telephone Encounter (Signed)
 [employer] application too old. Mailed application for repatha  again

## 2024-12-31 NOTE — Telephone Encounter (Signed)
 Hi, [employer] is asking for the repatha  prescription to please be sent to [facility]. Thank you!

## 2025-01-02 MED ORDER — REPATHA SURECLICK 140 MG/ML ~~LOC~~ SOAJ: 140.0000 mg | SUBCUTANEOUS | 3 refills | Status: AC

## 2025-01-02 NOTE — Addendum Note (Signed)
 Addended by: Miriam Kestler K on: 01/02/2025 09:56 AM   Modules accepted: Orders

## 2025-01-03 NOTE — Telephone Encounter (Signed)
 Hi, [facility] faxed again asking for the document to be signed and faxed back to them. I scanned it I media dated 12/30/24 and I will scan this in again from today.

## 2025-05-30 ENCOUNTER — Ambulatory Visit

## 2025-07-01 ENCOUNTER — Ambulatory Visit: Admitting: Adult Health
